# Patient Record
Sex: Female | Born: 1975 | Race: White | Hispanic: No | Marital: Married | Smoking: Never smoker
Health system: Southern US, Community
[De-identification: ages and names within clinical notes are randomized; demographics above are authoritative.]

## PROBLEM LIST (undated history)

## (undated) ENCOUNTER — Emergency Department (HOSPITAL_COMMUNITY): Payer: Self-pay

## (undated) DIAGNOSIS — N809 Endometriosis, unspecified: Secondary | ICD-10-CM

## (undated) DIAGNOSIS — Z800 Family history of malignant neoplasm of digestive organs: Secondary | ICD-10-CM

## (undated) DIAGNOSIS — IMO0002 Reserved for concepts with insufficient information to code with codable children: Secondary | ICD-10-CM

## (undated) DIAGNOSIS — R112 Nausea with vomiting, unspecified: Secondary | ICD-10-CM

## (undated) DIAGNOSIS — Z9889 Other specified postprocedural states: Secondary | ICD-10-CM

## (undated) DIAGNOSIS — Z8249 Family history of ischemic heart disease and other diseases of the circulatory system: Secondary | ICD-10-CM

## (undated) DIAGNOSIS — G43709 Chronic migraine without aura, not intractable, without status migrainosus: Secondary | ICD-10-CM

## (undated) HISTORY — DX: Endometriosis, unspecified: N80.9

## (undated) HISTORY — DX: Family history of malignant neoplasm of digestive organs: Z80.0

## (undated) HISTORY — DX: Chronic migraine without aura, not intractable, without status migrainosus: G43.709

## (undated) HISTORY — DX: Family history of ischemic heart disease and other diseases of the circulatory system: Z82.49

## (undated) HISTORY — DX: Reserved for concepts with insufficient information to code with codable children: IMO0002

---

## 2012-06-26 HISTORY — PX: OOPHORECTOMY: SHX86

## 2012-06-26 HISTORY — PX: SALPINGECTOMY: SHX328

## 2015-11-18 ENCOUNTER — Other Ambulatory Visit (HOSPITAL_COMMUNITY)
Admission: RE | Admit: 2015-11-18 | Discharge: 2015-11-18 | Disposition: A | Discharge status: Home / Self Care | Payer: 59 | Source: Ambulatory Visit | Attending: Obstetrics & Gynecology | Admitting: Obstetrics & Gynecology

## 2015-11-18 ENCOUNTER — Other Ambulatory Visit: Payer: Self-pay | Admitting: Obstetrics & Gynecology

## 2015-11-18 DIAGNOSIS — Z1151 Encounter for screening for human papillomavirus (HPV): Secondary | ICD-10-CM | POA: Diagnosis not present

## 2015-11-18 DIAGNOSIS — Z01419 Encounter for gynecological examination (general) (routine) without abnormal findings: Secondary | ICD-10-CM | POA: Diagnosis present

## 2015-11-23 LAB — CYTOLOGY - PAP

## 2016-04-12 ENCOUNTER — Telehealth: Payer: Self-pay | Admitting: Cardiology

## 2016-04-12 NOTE — Telephone Encounter (Signed)
Received records from [place] for appointment on 05/04/16 with Dr Ellyn Hack.  Records given to [facility] (medical records) for Dr Allison Quarry schedule on 05/04/16. lp

## 2016-05-02 ENCOUNTER — Encounter: Payer: Self-pay | Admitting: *Deleted

## 2016-05-04 ENCOUNTER — Ambulatory Visit (INDEPENDENT_AMBULATORY_CARE_PROVIDER_SITE_OTHER): Payer: 59 | Admitting: Cardiology

## 2016-05-04 ENCOUNTER — Encounter: Payer: Self-pay | Admitting: Cardiology

## 2016-05-04 VITALS — BP 108/64 | HR 80 | Ht 63.0 in | Wt 127.0 lb

## 2016-05-04 DIAGNOSIS — Z87898 Personal history of other specified conditions: Secondary | ICD-10-CM | POA: Diagnosis not present

## 2016-05-04 DIAGNOSIS — Z8249 Family history of ischemic heart disease and other diseases of the circulatory system: Secondary | ICD-10-CM | POA: Insufficient documentation

## 2016-05-04 NOTE — Patient Instructions (Signed)
SCHEDULE AT [place] has requested that you have an echocardiogram. Echocardiography is a painless test that uses sound waves to create images of your heart. It provides your doctor with information about the size and shape of your heart and how well your heart's chambers and valves are working. This procedure takes approximately one hour. There are no restrictions for this procedure.    NO OTHER CHANGES   Your physician recommends that you schedule a follow-up appointment in: 1 MONTHS WITH DR HARDING- IF TEST IS ABNORMAL,IF TEST IS NORMAL -PRN ( ON AS NEEDED BASIS)

## 2016-05-04 NOTE — Assessment & Plan Note (Signed)
She has a brother with a recent diagnosis of hypertrophic cardiomyopathy. It is reasonable for [place] to evaluate her with an echocardiogram to assess for hypertrophic her myocardium mouth feels well. Especially with her 3 sons. Features that make me more concerned that she may have the condition are heard at the site dizziness and prior syncopal events as well as her intermittent chest discomfort.  Plan: 2-D echocardiogram

## 2016-05-04 NOTE — Progress Notes (Signed)
PCP: Cyndy Freeze, MD  Clinic Note: Chief Complaint  Patient presents with  . Follow-up    New patient - consultation for cardiomyopathy.    HPI: Tina Morris is a 40 y.o. female with a PMH below who presents today for Cardiology consultation with family history of hypertrophic cardiomyopathy diagnosed in her brother.  Tina Morris was seen by her PCP on October 10. She was doing relatively well without any major complaints in general clinical evaluation. Based on her brother diagnosed with hypertrophic or adenopathy, there was a recommendation from his cardiologist that she have cardiology evaluation.  Recent Hospitalizations: None  Studies Reviewed: Her clinic EKG was evaluated showing sinus rhythm with nonspecific septal T-wave changes as well as possible RSR prime. Essentially normal axis and intervals. Relatively normal EKG. - Labs reviewed as noted below.  Interval History:  Presents for cardiology consultation. With intensive questioning, she notes that she has had chronic dizziness associated with being so dehydrated and with heat. She mostly notes it when she is in the shower. Years ago she had syncope getting into the shower without having anything to eat or drink. She controls this now by drinking something such as orange juice prior to getting the shower. She has not had any further syncope. She also notes that since her teenage years she's had these intermittent episodes of chest tightness/pain that is such shoots across her chest. The episodes may be last about a minute at a time and they're not at all associated with any particular activity - it can occur both at rest and with activity. Sometimes activity makes it worse, sometimes it doesn't.  Otherwise really no classic exertional chest tightness or pressure. No exertional dyspnea. She does not exercise, but is always on the go. Other than the shower related dizziness, no recurrent syncope or near syncope. No rapid irregular  heartbeats palpitations.   No TIA/amaurosis fugax symptoms.  ROS: A comprehensive was performed. Review of Systems  Constitutional: Negative.   HENT: Negative.   Eyes: Negative.   Respiratory: Negative.   Cardiovascular:       Per history of present illness  Gastrointestinal: Negative.  Negative for blood in stool and melena.  Genitourinary: Negative.  Negative for hematuria.  Musculoskeletal: Negative.   Skin: Negative.   Neurological: Positive for dizziness (Per history of present illness). Negative for speech change, focal weakness, loss of consciousness (None recently - with the shower. See history of present illness) and headaches.  Psychiatric/Behavioral: Negative for depression and memory loss. The patient is not nervous/anxious and does not have insomnia.   All other systems reviewed and are negative.   Past Medical History:  Diagnosis Date  . Chronic migraine   . Endometriosis   . Family history of colon cancer requiring screening colonoscopy   . Family history of hypertrophic cardiomyopathy    In her brother (who is 23 years older)    Past Surgical History:  Procedure Laterality Date  . CESAREAN SECTION  [id number]  . OOPHORECTOMY Left 2014  . SALPINGECTOMY Left 2014    Prior to Admission medications   Medication Sig Taking? Authorizing Provider  BIOTIN PO Take 1 tablet by mouth daily. Yes Historical Provider, MD  Flax Oil-Fish Oil-Borage Oil ([employer] OMEGA-3 PO) Take 1 tablet by mouth daily. Yes Historical Provider, MD  MAGNESIUM PO Take 1 tablet by mouth daily. Yes Historical Provider, MD  Multiple Vitamin (MULTIVITAMIN) tablet Take 1 tablet by mouth daily. Yes Historical Provider, MD  Prenatal Vit-Fe Fumarate-FA (PRENATAL MULTIVITAMIN)  TABS tablet Take 1 tablet by mouth daily at 12 noon. Yes Historical Provider, MD  TURMERIC PO Take 1 tablet by mouth daily. Yes Historical Provider, MD   Allergies  Allergen Reactions  . Amoxicillin Anaphylaxis  . Penicillins  Anaphylaxis  . Sulfa Antibiotics Anaphylaxis     Social History   Social History  . Marital status: Unknown    Spouse name: N/A  . Number of children: 3  . Years of education: N/A   Social History Main Topics  . Smoking status: Never Smoker  . Smokeless tobacco: Never Used  . Alcohol use 1.2 - 2.4 oz/week    1 - 2 Glasses of wine, 1 - 2 Cans of beer per week  . Drug use: Unknown  . Sexual activity: Not Asked   Other Topics Concern  . None   Social History Narrative   Married with 3 sons aged 67, 51 and 65    Family History  Problem Relation Age of Onset  . Breast cancer Mother   . Stroke Mother   . Hypercholesterolemia Mother   . Hypertension Mother   . CAD Mother   . Atrial fibrillation Mother   . Valvular heart disease Mother 18    Status post aVR/MVR  . Lung cancer Paternal Grandmother   . Hypercholesterolemia Father   . Hypertension Father   . Hypertrophic cardiomyopathy Brother 66    Unsure if this is familial or hypertensive  . Colon cancer Maternal Grandmother   . Congenital Murmur Son   . Healthy Son   . Congenital Murmur Son     [place] in heart.  . Arrhythmia Son     Extra heartbeats?  . Colon cancer Brother 27  - complete PMH, PSH, family and social history entered by Dr. Dimitri Ped Readings from Last 3 Encounters:  05/04/16 57.6 kg (127 lb)    PHYSICAL EXAM BP 108/64   Pulse 80   Ht 5\' 3"  (1.6 m)   Wt 57.6 kg (127 lb)   BMI 22.50 kg/m  General appearance: alert, cooperative, appears stated age, no distress and Healthy-appearing. Well-nourished and well-groomed. HEENT: [place]/AT, EOMI, MMM, anicteric sclera Neck: no adenopathy, no carotid bruit and no JVD Lungs: clear to auscultation bilaterally, normal percussion bilaterally and non-labored Heart: regular rate and rhythm, S1& S2 normal, no murmur, click, rub - cannot exclude soft S4. Nondisplaced PMI. Abdomen: soft, non-tender; bowel sounds normal; no masses,  no organomegaly; no  HJR Extremities: extremities normal, atraumatic, no cyanosis, or edema  Pulses: 2+ and symmetric;  Skin: mobility and turgor normal, no evidence of bleeding or bruising and no lesions noted  Neurologic: Mental status: Alert, oriented, thought content appropriate; normal gait Cranial nerves: normal (II-XII grossly intact)    Adult ECG Report  - PCPs office EKG reviewed  Other studies Reviewed: Additional studies/ records that were reviewed today include:  Recent Labs:  CT scan report from PCPs office - labs from 02/03/2016  CBC: W8.6, H/H 13.9/41.9, platelets 223.  Sodium 140, potassium 4.4, chloride 103, bicarbonate 20, BUN 9, glucose 86, calcium 9.2, total protein 6.9, albumin 4.3, total bilirubin 0.3, alkaline phosphatase 54, AST/ALT 14/12.  Total cholesterol 185, triglycerides 139, HDL 50, LDL 105   ASSESSMENT / PLAN: Problem List Items Addressed This Visit    H/O syncope (Chronic)    No further symptoms. I suspect this is truly related to hypovolemia. She would be more susceptible to this type of symptom if she does have hypertrophic cardiomyopathy, therefore  we'll check 2-D echo.      Relevant Orders   ECHOCARDIOGRAM COMPLETE   Family history of hypertrophic cardiomyopathy - Primary (Chronic)    She has a brother with a recent diagnosis of hypertrophic cardiomyopathy. It is reasonable for [place] to evaluate her with an echocardiogram to assess for hypertrophic her myocardium mouth feels well. Especially with her 3 sons. Features that make me more concerned that she may have the condition are heard at the site dizziness and prior syncopal events as well as her intermittent chest discomfort.  Plan: 2-D echocardiogram      Relevant Orders   ECHOCARDIOGRAM COMPLETE     Extensive family history reviewed including both her brother and her sons. We discussed multiple different symptoms that have gone on since a young age. Prolonged discussion (~45 min-1 hr with 20+ min chart  review), greater than 50% of which was in direct consultation.  Current medicines are reviewed at length with the patient today. (+/- concerns) None The following changes have been made: None  Patient Instructions  SCHEDULE AT [place] has requested that you have an echocardiogram. Echocardiography is a painless test that uses sound waves to create images of your heart. It provides your doctor with information about the size and shape of your heart and how well your heart's chambers and valves are working. This procedure takes approximately one hour. There are no restrictions for this procedure.    NO OTHER CHANGES   Your physician recommends that you schedule a follow-up appointment in: 1 MONTHS WITH DR Marrisa Kimber- IF TEST IS ABNORMAL,IF TEST IS NORMAL -PRN ( ON AS NEEDED BASIS)    Studies Ordered:   Orders Placed This Encounter  Procedures  . ECHOCARDIOGRAM COMPLETE      Glenetta Hew, M.D., M.S. Interventional Cardiologist   Pager # [phone] Phone # [phone] [address] [place], [place] [address]

## 2016-05-04 NOTE — Assessment & Plan Note (Signed)
No further symptoms. I suspect this is truly related to hypovolemia. She would be more susceptible to this type of symptom if she does have hypertrophic cardiomyopathy, therefore we'll check 2-D echo.

## 2016-05-26 ENCOUNTER — Ambulatory Visit (HOSPITAL_COMMUNITY): Payer: 59 | Attending: Cardiovascular Disease

## 2016-05-26 ENCOUNTER — Other Ambulatory Visit: Payer: Self-pay

## 2016-05-26 DIAGNOSIS — Z87898 Personal history of other specified conditions: Secondary | ICD-10-CM | POA: Diagnosis not present

## 2016-05-26 DIAGNOSIS — Z8249 Family history of ischemic heart disease and other diseases of the circulatory system: Secondary | ICD-10-CM | POA: Insufficient documentation

## 2016-05-29 ENCOUNTER — Ambulatory Visit: Payer: 59 | Admitting: Cardiology

## 2016-05-29 ENCOUNTER — Telehealth: Payer: Self-pay | Admitting: Cardiology

## 2016-05-29 NOTE — Telephone Encounter (Signed)
Please call Echo results to patient--she cancelled her appointment with Dr. Ellyn Hack today--thought it may be unnecessary.

## 2016-05-29 NOTE — Progress Notes (Signed)
Echo results:  Essentially normal echo Good news: Essentially normal echocardiogram and normal pump function and normal valve function. (Ejection fraction) EF: 55-60% - normal No evidence of hypertrophic cardiomyopathy. Normal wall thickness. No regional wall motion abnormalities = No signs to suggest heart attack.  Glenetta Hew, MD  Please for Dr. Trilby Drummer

## 2016-05-29 NOTE — Telephone Encounter (Signed)
Spoke to patient.  Echo Result given. Patient had question. per DR harding probably recheck in 5 years. Inquiring about children 9,14,18 ( possible after 39 Yrs old.check children unless sports activity - talk with pediatrician/pediatric cardiologist.) Verbalized understanding.

## 2017-03-14 ENCOUNTER — Emergency Department (HOSPITAL_COMMUNITY): Payer: BLUE CROSS/BLUE SHIELD

## 2017-03-14 ENCOUNTER — Emergency Department (HOSPITAL_COMMUNITY)
Admission: EM | Admit: 2017-03-14 | Discharge: 2017-03-15 | Disposition: A | Discharge status: Home / Self Care | Payer: BLUE CROSS/BLUE SHIELD | Attending: Emergency Medicine | Admitting: Emergency Medicine

## 2017-03-14 ENCOUNTER — Encounter (HOSPITAL_COMMUNITY): Payer: Self-pay | Admitting: *Deleted

## 2017-03-14 DIAGNOSIS — R0602 Shortness of breath: Secondary | ICD-10-CM

## 2017-03-14 DIAGNOSIS — Z79899 Other long term (current) drug therapy: Secondary | ICD-10-CM | POA: Insufficient documentation

## 2017-03-14 DIAGNOSIS — F419 Anxiety disorder, unspecified: Secondary | ICD-10-CM | POA: Insufficient documentation

## 2017-03-14 DIAGNOSIS — R091 Pleurisy: Secondary | ICD-10-CM | POA: Insufficient documentation

## 2017-03-14 DIAGNOSIS — R071 Chest pain on breathing: Secondary | ICD-10-CM | POA: Diagnosis present

## 2017-03-14 LAB — CBC
HEMATOCRIT: 38.7 % (ref 36.0–46.0)
HEMOGLOBIN: 12.9 g/dL (ref 12.0–15.0)
MCH: 29.4 pg (ref 26.0–34.0)
MCHC: 33.3 g/dL (ref 30.0–36.0)
MCV: 88.2 fL (ref 78.0–100.0)
Platelets: 222 10*3/uL (ref 150–400)
RBC: 4.39 MIL/uL (ref 3.87–5.11)
RDW: 12.8 % (ref 11.5–15.5)
WBC: 10.6 10*3/uL — ABNORMAL HIGH (ref 4.0–10.5)

## 2017-03-14 LAB — BASIC METABOLIC PANEL
ANION GAP: 11 (ref 5–15)
BUN: 9 mg/dL (ref 6–20)
CO2: 19 mmol/L — ABNORMAL LOW (ref 22–32)
Calcium: 9.2 mg/dL (ref 8.9–10.3)
Chloride: 108 mmol/L (ref 101–111)
Creatinine, Ser: 0.73 mg/dL (ref 0.44–1.00)
GFR calc non Af Amer: >60 mL/min (ref >60)
Glucose, Bld: 89 mg/dL (ref 65–99)
POTASSIUM: 4 mmol/L (ref 3.5–5.1)
SODIUM: 138 mmol/L (ref 135–145)

## 2017-03-14 LAB — I-STAT TROPONIN, ED: Troponin i, poc: 0.01 ng/mL (ref 0.00–0.08)

## 2017-03-14 LAB — D-DIMER, QUANTITATIVE (NOT AT ARMC)

## 2017-03-14 MED ORDER — NAPROXEN 500 MG PO TABS: 500.0000 mg | ORAL_TABLET | Freq: Two times a day (BID) | ORAL | 0 refills | Status: DC

## 2017-03-14 MED ORDER — ALPRAZOLAM 0.5 MG PO TABS: 0.5000 mg | ORAL_TABLET | Freq: Two times a day (BID) | ORAL | 0 refills | Status: DC | PRN

## 2017-03-14 MED ORDER — IOPAMIDOL (ISOVUE-370) INJECTION 76%
INTRAVENOUS | Status: AC
  Administered 2017-03-14: 100 mL
  Filled 2017-03-14: qty 100

## 2017-03-14 MED ORDER — MORPHINE SULFATE (PF) 4 MG/ML IV SOLN
4.0000 mg | Freq: Once | INTRAVENOUS | Status: AC
  Administered 2017-03-14: 4 mg via INTRAVENOUS
  Filled 2017-03-14: qty 1

## 2017-03-14 MED ORDER — KETOROLAC TROMETHAMINE 30 MG/ML IJ SOLN
30.0000 mg | Freq: Once | INTRAMUSCULAR | Status: AC
  Administered 2017-03-14: 30 mg via INTRAVENOUS
  Filled 2017-03-14: qty 1

## 2017-03-14 MED ORDER — LORAZEPAM 2 MG/ML IJ SOLN
1.0000 mg | Freq: Once | INTRAMUSCULAR | Status: AC
  Administered 2017-03-14: 1 mg via INTRAVENOUS
  Filled 2017-03-14: qty 1

## 2017-03-14 NOTE — ED Notes (Signed)
Holding medication per pt request pt denies pain at present
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
## 2017-03-14 NOTE — ED Triage Notes (Signed)
Pt reports onset today of mid chest discomfort and sob, reports feeling like it is difficult to take a deep breath. Pt has tremors of her extremities and appears anxious at triage. Went to an ucc and sent here to r/o PE.

## 2017-03-14 NOTE — ED Notes (Signed)
Patient transported to CT 

## 2018-12-31 DIAGNOSIS — R102 Pelvic and perineal pain: Secondary | ICD-10-CM | POA: Diagnosis not present

## 2018-12-31 DIAGNOSIS — N809 Endometriosis, unspecified: Secondary | ICD-10-CM | POA: Diagnosis not present

## 2018-12-31 DIAGNOSIS — Z01411 Encounter for gynecological examination (general) (routine) with abnormal findings: Secondary | ICD-10-CM | POA: Diagnosis not present

## 2018-12-31 LAB — CYTOLOGY - PAP: Pap: NEGATIVE

## 2019-04-07 DIAGNOSIS — N83201 Unspecified ovarian cyst, right side: Secondary | ICD-10-CM | POA: Diagnosis not present

## 2019-04-07 DIAGNOSIS — M545 Low back pain: Secondary | ICD-10-CM | POA: Diagnosis not present

## 2019-04-25 DIAGNOSIS — M545 Low back pain: Secondary | ICD-10-CM | POA: Diagnosis not present

## 2019-04-25 DIAGNOSIS — N83201 Unspecified ovarian cyst, right side: Secondary | ICD-10-CM | POA: Diagnosis not present

## 2019-04-25 DIAGNOSIS — R3989 Other symptoms and signs involving the genitourinary system: Secondary | ICD-10-CM | POA: Diagnosis not present

## 2019-04-25 DIAGNOSIS — N809 Endometriosis, unspecified: Secondary | ICD-10-CM | POA: Diagnosis not present

## 2019-04-30 ENCOUNTER — Other Ambulatory Visit: Payer: Self-pay

## 2019-04-30 ENCOUNTER — Encounter (HOSPITAL_BASED_OUTPATIENT_CLINIC_OR_DEPARTMENT_OTHER): Payer: Self-pay | Admitting: *Deleted

## 2019-04-30 NOTE — H&P (Signed)
43yo [id number] who presents for laparoscopic ovarian cystectomy and possible resection of endometriosis.  In review- she has a known right Ovarian cyst- Recent US- (04/07/2019)- 8.3cm anteverted uterus- small ant 1.4cm fibroid- stable in size. Prior LSO, Right ovary- 5.5x5.4x3.8 cyst with thin septations- stable in size, avascular. Today she notes that she is not having any pelvic or abdominal pain. Some left sided back pain, but pain is only noticeable during her menses; however, during that time she is miserable. In review, she had called in on 10/08 due to significant cramps and back pain. Pain woke her up and was severe for several hours. While the pain does improve with Ibuprofen, she is taking round the clock because as soon as it wears off, the pain returns. Denies nausea/vomiting, no other symptoms Last US performed Sept 2017 that showed 4.6cm cyst with thin septations- increase in size from prior US in March.   -h/o Endometriosis- Currently on OCPs for management. Menses regular each month- denies HMB or dysmenorrhea.      Current Medications  Taking   Larissia(Levonorgestrel-Ethinyl Estrad) 0.1-20 MG-MCG Tablet 1 tablet Orally Once a day   Fish Oil 500 MG Capsule Orally   Multivitamin Tablet by mouth   Turmeric 500 MG Capsule Orally   Spirulina 500 MG Tablet Orally   Biotin 400 MCG Capsule 1 tablet Orally Once a day   Vitamin B-1 100 MG Tablet 1 tablet Orally Once a day   Magnesium 200 MG Tablet 1 capsule with a meal Orally Once a day   Valerian Root   Not-Taking   Aviane(Levonorgestrel-Ethinyl Estrad) 0.1-20 MG-MCG Tablet 1 tablet Orally once a day   Medication List reviewed and reconciled with the patient    Past Medical History  Endometriosis.    Surgical History  C-section   Laparoscopy, D&C for Endometriosis x 3 or 4 times. During one of those surgeries, left ovary and left fallopian tube was removed.    Family History  Father: alive, Heart disease, diagnosed with  Hypertension  Mother: deceased, Hypertension  Paternal Marshall Father: deceased, Hypertension  Paternal Grand Mother: deceased, Hypertension, Ovarian cancer  Maternal Grand Father: deceased, Hypertension  Maternal Grand Mother: deceased, Uterine cancer, Hypertension, Breast cancer, Colon cancer  Brother 1: deceased, Colon Cancer  Brother2: alive, Heart disease   Social History  General:  no EXPOSURE TO PASSIVE SMOKE.  no Alcohol.  Tobacco use  cigarettes: Never smoked Tobacco history last updated 04/25/2019 no Recreational drug use.    Gyn History  Sexual activity currently sexually active.  Periods : every month.  LMP 04-02-2019 .  Birth control ocp.  Last pap smear date 12-2018.  Denies H/O Last mammogram date.  Denies H/O Abnormal pap smear.    OB History  Number of pregnancies 3.  Pregnancy # 1 live birth, vaginal delivery.  Pregnancy # 2 live birth, C-section-Placenta abruption.  Pregnancy # 3 live birth, C-section.    Allergies  Penicillin: anaphylaxis  Amoxicillin: anaphylaxis  Sulfa Drugs: Itching   Vicryl sutures   Hospitalization/Major Diagnostic Procedure  See surgeries    Review of Systems  CONSTITUTIONAL:  no Appetite changes. no Chills. no Fatigue. no Fever.  CARDIOLOGY:  no Chest pain.  RESPIRATORY:  no Shortness of breath. no Cough.  UROLOGY:  no Dysuria. no Urinary frequency. no Urinary incontinence. no Urinary urgency.  GASTROENTEROLOGY:  no Abdominal pain. no Change in bowel habits. no Change in bowel movements.  FEMALE REPRODUCTIVE:  See HPI for details. no Abnormal vaginal discharge. no  Breast lumps or discharge. no Breast pain. no Hot flashes. no Sexual problems. no Vaginal irritation. no Vaginal itching.  NEUROLOGY:  no Dizziness. no Headache.  PSYCHOLOGY:  no Anxiety. no Depression.  SKIN:  no Rash. no Hives.  HEMATOLOGY/LYMPH:  no Anemia. Using Blood Thinners no.     Vital Signs  Wt 131.4, Wt change -3.2 lb, Ht 67.5, BMI 20.27,  Temp 97.9, Pulse sitting 70, BP sitting 114/70.   Examination  General Examination: CONSTITUTIONAL: well developed, well nourished.  SKIN: warm and dry, no rashes.  NECK: supple, normal appearance.  LUNGS: clear to auscultation bilaterally, no wheezes, rhonchi, rales.  HEART: no murmurs, regular rate and rhythm.  ABDOMEN: soft and not tender, no masses palpated, no rebound, no rigidity.  MUSCULOSKELETAL no calf tenderness bilaterally.  EXTREMITIES: no edema present.  PSYCH: appropriate mood and affect.     A/P: 43yo G2P2 who presents for laparoscopic ovarian cystectomy and possible resection of endometriosis. -NPO -LR @ 125cc/hr -SCDs to OR -Risk/benefit and alternatives reviewed with patient including but not limited to risk of bleeding, infection and injury to surrounding organs.  Discussed potential for oophorectomy- due to prior left oophorectomy, will plan for ovarian reservation if possible.  Questions and concerns were addressed and she desires to proceed.  Janyth Pupa, DO [phone] (cell) [phone] (office)

## 2019-05-03 ENCOUNTER — Other Ambulatory Visit (HOSPITAL_COMMUNITY)
Admission: RE | Admit: 2019-05-03 | Discharge: 2019-05-03 | Disposition: A | Discharge status: Home / Self Care | Payer: BC Managed Care – PPO | Source: Ambulatory Visit | Attending: Obstetrics & Gynecology | Admitting: Obstetrics & Gynecology

## 2019-05-03 DIAGNOSIS — Z20828 Contact with and (suspected) exposure to other viral communicable diseases: Secondary | ICD-10-CM | POA: Diagnosis not present

## 2019-05-03 DIAGNOSIS — Z01812 Encounter for preprocedural laboratory examination: Secondary | ICD-10-CM | POA: Diagnosis not present

## 2019-05-04 LAB — NOVEL CORONAVIRUS, NAA (HOSP ORDER, SEND-OUT TO REF LAB; TAT 18-24 HRS): SARS-CoV-2, NAA: NOT DETECTED

## 2019-05-06 ENCOUNTER — Encounter (HOSPITAL_BASED_OUTPATIENT_CLINIC_OR_DEPARTMENT_OTHER)
Admission: RE | Admit: 2019-05-06 | Discharge: 2019-05-06 | Disposition: A | Discharge status: Home / Self Care | Payer: BC Managed Care – PPO | Source: Ambulatory Visit | Attending: Obstetrics & Gynecology | Admitting: Obstetrics & Gynecology

## 2019-05-06 ENCOUNTER — Other Ambulatory Visit: Payer: Self-pay

## 2019-05-06 DIAGNOSIS — Z01812 Encounter for preprocedural laboratory examination: Secondary | ICD-10-CM | POA: Diagnosis not present

## 2019-05-06 DIAGNOSIS — D270 Benign neoplasm of right ovary: Secondary | ICD-10-CM | POA: Diagnosis not present

## 2019-05-06 DIAGNOSIS — Z793 Long term (current) use of hormonal contraceptives: Secondary | ICD-10-CM | POA: Diagnosis not present

## 2019-05-06 DIAGNOSIS — N83201 Unspecified ovarian cyst, right side: Secondary | ICD-10-CM | POA: Diagnosis not present

## 2019-05-06 DIAGNOSIS — N803 Endometriosis of pelvic peritoneum: Secondary | ICD-10-CM | POA: Diagnosis not present

## 2019-05-06 LAB — COMPREHENSIVE METABOLIC PANEL
ALT: 12 U/L (ref 0–44)
AST: 16 U/L (ref 15–41)
Albumin: 3.9 g/dL (ref 3.5–5.0)
Alkaline Phosphatase: 59 U/L (ref 38–126)
Anion gap: 8 (ref 5–15)
BUN: 12 mg/dL (ref 6–20)
CO2: 23 mmol/L (ref 22–32)
Calcium: 9.2 mg/dL (ref 8.9–10.3)
Chloride: 106 mmol/L (ref 98–111)
Creatinine, Ser: 0.85 mg/dL (ref 0.44–1.00)
GFR calc Af Amer: >60 mL/min (ref >60)
GFR calc non Af Amer: >60 mL/min (ref >60)
Glucose, Bld: 88 mg/dL (ref 70–99)
Potassium: 4.5 mmol/L (ref 3.5–5.1)
Sodium: 137 mmol/L (ref 135–145)
Total Bilirubin: 0.6 mg/dL (ref 0.3–1.2)
Total Protein: 6.6 g/dL (ref 6.5–8.1)

## 2019-05-06 LAB — CBC
HCT: 43.9 % (ref 36.0–46.0)
Hemoglobin: 14.5 g/dL (ref 12.0–15.0)
MCH: 30.1 pg (ref 26.0–34.0)
MCHC: 33 g/dL (ref 30.0–36.0)
MCV: 91.1 fL (ref 80.0–100.0)
Platelets: 243 10*3/uL (ref 150–400)
RBC: 4.82 MIL/uL (ref 3.87–5.11)
RDW: 12.3 % (ref 11.5–15.5)
WBC: 8.3 10*3/uL (ref 4.0–10.5)
nRBC: 0 % (ref 0.0–0.2)

## 2019-05-06 LAB — TYPE AND SCREEN
ABO/RH(D): O POS
Antibody Screen: NEGATIVE

## 2019-05-06 LAB — POCT PREGNANCY, URINE: Preg Test, Ur: NEGATIVE

## 2019-05-06 LAB — ABO/RH: ABO/RH(D): O POS

## 2019-05-06 NOTE — Progress Notes (Signed)

## 2019-05-07 ENCOUNTER — Ambulatory Visit (HOSPITAL_BASED_OUTPATIENT_CLINIC_OR_DEPARTMENT_OTHER): Payer: BC Managed Care – PPO | Admitting: Certified Registered"

## 2019-05-07 ENCOUNTER — Other Ambulatory Visit: Payer: Self-pay

## 2019-05-07 ENCOUNTER — Ambulatory Visit (HOSPITAL_COMMUNITY)
Admission: RE | Admit: 2019-05-07 | Discharge: 2019-05-07 | Disposition: A | Discharge status: Home / Self Care | Payer: BC Managed Care – PPO | Source: Ambulatory Visit | Attending: Obstetrics & Gynecology | Admitting: Obstetrics & Gynecology

## 2019-05-07 ENCOUNTER — Encounter (HOSPITAL_BASED_OUTPATIENT_CLINIC_OR_DEPARTMENT_OTHER)
Admission: RE | Disposition: A | Discharge status: Home / Self Care | Payer: Self-pay | Source: Ambulatory Visit | Attending: Obstetrics & Gynecology

## 2019-05-07 ENCOUNTER — Encounter (HOSPITAL_BASED_OUTPATIENT_CLINIC_OR_DEPARTMENT_OTHER): Payer: Self-pay | Admitting: *Deleted

## 2019-05-07 DIAGNOSIS — N83201 Unspecified ovarian cyst, right side: Secondary | ICD-10-CM

## 2019-05-07 DIAGNOSIS — Z793 Long term (current) use of hormonal contraceptives: Secondary | ICD-10-CM | POA: Insufficient documentation

## 2019-05-07 DIAGNOSIS — N803 Endometriosis of pelvic peritoneum: Secondary | ICD-10-CM | POA: Diagnosis not present

## 2019-05-07 DIAGNOSIS — D270 Benign neoplasm of right ovary: Secondary | ICD-10-CM | POA: Diagnosis not present

## 2019-05-07 DIAGNOSIS — N809 Endometriosis, unspecified: Secondary | ICD-10-CM | POA: Diagnosis not present

## 2019-05-07 HISTORY — PX: LAPAROSCOPIC OVARIAN CYSTECTOMY: SHX6248

## 2019-05-07 HISTORY — DX: Nausea with vomiting, unspecified: R11.2

## 2019-05-07 HISTORY — DX: Other specified postprocedural states: Z98.890

## 2019-05-07 SURGERY — EXCISION, CYST, OVARY, LAPAROSCOPIC
Anesthesia: General | Site: Abdomen

## 2019-05-07 MED ORDER — IBUPROFEN 600 MG PO TABS: 600.0000 mg | ORAL_TABLET | Freq: Four times a day (QID) | ORAL | 0 refills | Status: DC | PRN

## 2019-05-07 MED ORDER — ACETAMINOPHEN 325 MG PO TABS: 325.0000 mg | ORAL_TABLET | ORAL | Status: DC | PRN

## 2019-05-07 MED ORDER — LIDOCAINE HCL 1 % IJ SOLN
INTRAMUSCULAR | Status: DC | PRN
  Administered 2019-05-07: 30 mL

## 2019-05-07 MED ORDER — ONDANSETRON HCL 4 MG/2ML IJ SOLN
INTRAMUSCULAR | Status: AC
  Filled 2019-05-07: qty 2

## 2019-05-07 MED ORDER — FENTANYL CITRATE (PF) 100 MCG/2ML IJ SOLN
INTRAMUSCULAR | Status: AC
  Filled 2019-05-07: qty 2

## 2019-05-07 MED ORDER — LIDOCAINE HCL (PF) 1 % IJ SOLN
INTRAMUSCULAR | Status: AC
  Filled 2019-05-07: qty 30

## 2019-05-07 MED ORDER — FENTANYL CITRATE (PF) 100 MCG/2ML IJ SOLN
25.0000 ug | INTRAMUSCULAR | Status: DC | PRN
  Administered 2019-05-07: 50 ug via INTRAVENOUS

## 2019-05-07 MED ORDER — SOD CITRATE-CITRIC ACID 500-334 MG/5ML PO SOLN
ORAL | Status: AC
  Filled 2019-05-07: qty 30

## 2019-05-07 MED ORDER — ROCURONIUM BROMIDE 100 MG/10ML IV SOLN
INTRAVENOUS | Status: DC | PRN
  Administered 2019-05-07: 60 mg via INTRAVENOUS

## 2019-05-07 MED ORDER — ESMOLOL HCL 100 MG/10ML IV SOLN
INTRAVENOUS | Status: AC
  Filled 2019-05-07: qty 10

## 2019-05-07 MED ORDER — ONDANSETRON HCL 4 MG/2ML IJ SOLN
4.0000 mg | Freq: Once | INTRAMUSCULAR | Status: AC | PRN
  Administered 2019-05-07: 14:00:00 4 mg via INTRAVENOUS

## 2019-05-07 MED ORDER — OXYCODONE HCL 5 MG PO TABS: 5.0000 mg | ORAL_TABLET | Freq: Once | ORAL | Status: DC | PRN

## 2019-05-07 MED ORDER — SOD CITRATE-CITRIC ACID 500-334 MG/5ML PO SOLN
30.0000 mL | ORAL | Status: AC
  Administered 2019-05-07: 12:00:00 30 mL via ORAL

## 2019-05-07 MED ORDER — KETOROLAC TROMETHAMINE 15 MG/ML IJ SOLN
INTRAMUSCULAR | Status: DC | PRN
  Administered 2019-05-07: 30 mg via INTRAVENOUS

## 2019-05-07 MED ORDER — PROPOFOL 10 MG/ML IV BOLUS
INTRAVENOUS | Status: DC | PRN
  Administered 2019-05-07: 200 mg via INTRAVENOUS

## 2019-05-07 MED ORDER — ACETAMINOPHEN 500 MG PO TABS
ORAL_TABLET | ORAL | Status: AC
  Filled 2019-05-07: qty 2

## 2019-05-07 MED ORDER — ACETAMINOPHEN 160 MG/5ML PO SOLN: 325.0000 mg | ORAL | Status: DC | PRN

## 2019-05-07 MED ORDER — SCOPOLAMINE 1 MG/3DAYS TD PT72
1.0000 | MEDICATED_PATCH | TRANSDERMAL | Status: DC
  Administered 2019-05-07: 12:00:00 1.5 mg via TRANSDERMAL

## 2019-05-07 MED ORDER — LIDOCAINE 2% (20 MG/ML) 5 ML SYRINGE
INTRAMUSCULAR | Status: AC
  Filled 2019-05-07: qty 5

## 2019-05-07 MED ORDER — KETOROLAC TROMETHAMINE 30 MG/ML IJ SOLN: 30.0000 mg | Freq: Once | INTRAMUSCULAR | Status: DC | PRN

## 2019-05-07 MED ORDER — MIDAZOLAM HCL 2 MG/2ML IJ SOLN
INTRAMUSCULAR | Status: AC
  Filled 2019-05-07: qty 2

## 2019-05-07 MED ORDER — FENTANYL CITRATE (PF) 100 MCG/2ML IJ SOLN
INTRAMUSCULAR | Status: DC | PRN
  Administered 2019-05-07: 100 ug via INTRAVENOUS

## 2019-05-07 MED ORDER — PROPOFOL 500 MG/50ML IV EMUL
INTRAVENOUS | Status: DC | PRN
  Administered 2019-05-07: 25 ug/kg/min via INTRAVENOUS

## 2019-05-07 MED ORDER — DOCUSATE SODIUM 100 MG PO CAPS: 100.0000 mg | ORAL_CAPSULE | Freq: Two times a day (BID) | ORAL | 0 refills | Status: DC | PRN

## 2019-05-07 MED ORDER — OXYCODONE-ACETAMINOPHEN 5-325 MG PO TABS: 1.0000 | ORAL_TABLET | Freq: Four times a day (QID) | ORAL | 0 refills | Status: AC | PRN

## 2019-05-07 MED ORDER — SUGAMMADEX SODIUM 200 MG/2ML IV SOLN
INTRAVENOUS | Status: DC | PRN
  Administered 2019-05-07: 200 mg via INTRAVENOUS

## 2019-05-07 MED ORDER — OXYCODONE HCL 5 MG/5ML PO SOLN: 5.0000 mg | Freq: Once | ORAL | Status: DC | PRN

## 2019-05-07 MED ORDER — ACETAMINOPHEN 500 MG PO TABS
1000.0000 mg | ORAL_TABLET | ORAL | Status: AC
  Administered 2019-05-07: 12:00:00 1000 mg via ORAL

## 2019-05-07 MED ORDER — LACTATED RINGERS IV SOLN
INTRAVENOUS | Status: DC
  Administered 2019-05-07 (×2): via INTRAVENOUS

## 2019-05-07 MED ORDER — SCOPOLAMINE 1 MG/3DAYS TD PT72
MEDICATED_PATCH | TRANSDERMAL | Status: AC
  Filled 2019-05-07: qty 1

## 2019-05-07 MED ORDER — LIDOCAINE HCL (CARDIAC) PF 100 MG/5ML IV SOSY
PREFILLED_SYRINGE | INTRAVENOUS | Status: DC | PRN
  Administered 2019-05-07: 60 mg via INTRAVENOUS

## 2019-05-07 MED ORDER — MIDAZOLAM HCL 5 MG/5ML IJ SOLN
INTRAMUSCULAR | Status: DC | PRN
  Administered 2019-05-07: 2 mg via INTRAVENOUS

## 2019-05-07 MED ORDER — LACTATED RINGERS IV SOLN: INTRAVENOUS | Status: DC

## 2019-05-07 MED ORDER — DEXAMETHASONE SODIUM PHOSPHATE 4 MG/ML IJ SOLN
INTRAMUSCULAR | Status: DC | PRN
  Administered 2019-05-07: 10 mg via INTRAVENOUS

## 2019-05-07 MED ORDER — MEPERIDINE HCL 25 MG/ML IJ SOLN: 6.2500 mg | INTRAMUSCULAR | Status: DC | PRN

## 2019-05-07 SURGICAL SUPPLY — 36 items
APPLICATOR ARISTA FLEXITIP XL (MISCELLANEOUS) ×3 IMPLANT
BRIEF STRETCH FOR OB PAD XXL (UNDERPADS AND DIAPERS) ×3 IMPLANT
CLOTH BEACON ORANGE TIMEOUT ST (SAFETY) IMPLANT
DERMABOND ADVANCED (GAUZE/BANDAGES/DRESSINGS) ×2
DERMABOND ADVANCED .7 DNX12 (GAUZE/BANDAGES/DRESSINGS) ×1 IMPLANT
DRSG OPSITE POSTOP 3X4 (GAUZE/BANDAGES/DRESSINGS) ×3 IMPLANT
DURAPREP 26ML APPLICATOR (WOUND CARE) ×3 IMPLANT
FORCEPS CUTTING 33CM 5MM (CUTTING FORCEPS) ×3 IMPLANT
GAUZE 4X4 16PLY RFD (DISPOSABLE) ×3 IMPLANT
GLOVE BIOGEL PI IND STRL 6.5 (GLOVE) ×2 IMPLANT
GLOVE BIOGEL PI IND STRL 7.0 (GLOVE) ×2 IMPLANT
GLOVE BIOGEL PI INDICATOR 6.5 (GLOVE) ×4
GLOVE BIOGEL PI INDICATOR 7.0 (GLOVE) ×4
GLOVE ECLIPSE 6.5 STRL STRAW (GLOVE) ×6 IMPLANT
GOWN STRL REUS W/TWL LRG LVL3 (GOWN DISPOSABLE) ×12 IMPLANT
HEMOSTAT ARISTA ABSORB 3G PWDR (HEMOSTASIS) ×3 IMPLANT
NEEDLE INSUFFLATION 120MM (ENDOMECHANICALS) ×3 IMPLANT
PACK LAPAROSCOPY BASIN (CUSTOM PROCEDURE TRAY) ×3 IMPLANT
PACK TRENDGUARD 450 HYBRID PRO (MISCELLANEOUS) ×1 IMPLANT
PACK TRENDGUARD 600 HYBRD PROC (MISCELLANEOUS) IMPLANT
PAD OB MATERNITY 4.3X12.25 (PERSONAL CARE ITEMS) ×3 IMPLANT
POUCH SPECIMEN RETRIEVAL 10MM (ENDOMECHANICALS) IMPLANT
SET IRRIG TUBING LAPAROSCOPIC (IRRIGATION / IRRIGATOR) ×3 IMPLANT
SHEARS HARMONIC ACE PLUS 36CM (ENDOMECHANICALS) ×3 IMPLANT
SLEEVE SCD COMPRESS KNEE MED (MISCELLANEOUS) ×3 IMPLANT
SLEEVE XCEL OPT CAN 5 100 (ENDOMECHANICALS) ×3 IMPLANT
SOLUTION ELECTROLUBE (MISCELLANEOUS) ×3 IMPLANT
SUT MON AB 4-0 PS1 27 (SUTURE) ×3 IMPLANT
SUT VICRYL 0 UR6 27IN ABS (SUTURE) ×3 IMPLANT
TOWEL GREEN STERILE FF (TOWEL DISPOSABLE) ×6 IMPLANT
TRAY FOLEY W/BAG SLVR 14FR LF (SET/KITS/TRAYS/PACK) ×3 IMPLANT
TRENDGUARD 450 HYBRID PRO PACK (MISCELLANEOUS) ×3
TRENDGUARD 600 HYBRID PROC PK (MISCELLANEOUS)
TROCAR XCEL NON-BLD 11X100MML (ENDOMECHANICALS) IMPLANT
TROCAR XCEL NON-BLD 5MMX100MML (ENDOMECHANICALS) ×3 IMPLANT
WARMER LAPAROSCOPE (MISCELLANEOUS) ×3 IMPLANT

## 2019-05-07 NOTE — Anesthesia Postprocedure Evaluation (Signed)
Anesthesia Post Note  Patient: Tina Morris  Procedure(s) Performed: LAPAROSCOPIC OVARIAN CYSTECTOMY, POSSIBLE RESECTION OF ENDOMETRIOSIS (Abdomen)     Patient location during evaluation: Phase II Anesthesia Type: General Level of consciousness: sedated Pain management: pain level controlled Vital Signs Assessment: post-procedure vital signs reviewed and stable Respiratory status: spontaneous breathing Cardiovascular status: stable Postop Assessment: no apparent nausea or vomiting Anesthetic complications: no    Last Vitals:  Vitals:   05/07/19 1415 05/07/19 1430  BP: 107/81 109/75  Pulse: 75 74  Resp: (!) 9 12  Temp:    SpO2: 100% 100%    Last Pain:  Vitals:   05/07/19 1430  TempSrc:   PainSc: 3    Pain Goal: Patients Stated Pain Goal: 3 (05/07/19 1209)                 Huston Foley

## 2019-05-07 NOTE — Anesthesia Preprocedure Evaluation (Signed)
Anesthesia Evaluation  Patient identified by MRN, date of birth, ID band Patient awake    History of Anesthesia Complications (+) PONV and history of anesthetic complications  Airway Mallampati: I       Dental no notable dental hx. (+) Teeth Intact   Pulmonary neg pulmonary ROS,    Pulmonary exam normal breath sounds clear to auscultation       Cardiovascular negative cardio ROS Normal cardiovascular exam Rhythm:Regular Rate:Normal     Neuro/Psych  Headaches, negative psych ROS   GI/Hepatic negative GI ROS, Neg liver ROS,   Endo/Other  negative endocrine ROS  Renal/GU negative Renal ROS  negative genitourinary   Musculoskeletal negative musculoskeletal ROS (+)   Abdominal Normal abdominal exam  (+)   Peds  Hematology negative hematology ROS (+)   Anesthesia Other Findings   Reproductive/Obstetrics                             Anesthesia Physical Anesthesia Plan  ASA: II  Anesthesia Plan: General   Post-op Pain Management:    Induction: Intravenous  PONV Risk Score and Plan: Ondansetron, Dexamethasone, Midazolam and Scopolamine patch - Pre-op  Airway Management Planned: Oral ETT  Additional Equipment: None  Intra-op Plan:   Post-operative Plan: Extubation in OR  Informed Consent: I have reviewed the patients History and Physical, chart, labs and discussed the procedure including the risks, benefits and alternatives for the proposed anesthesia with the patient or authorized representative who has indicated his/her understanding and acceptance.     Dental advisory given  Plan Discussed with: CRNA  Anesthesia Plan Comments:         Anesthesia Quick Evaluation

## 2019-05-07 NOTE — Op Note (Signed)
PREOPERATIVE DIAGNOSIS:  Right ovarian cyst POSTOPERATIVE DIAGNOSIS: same and endometriosis PROCEDURE PERFORMED: Right ovarian cystectomy and ablation of endometriosis SURGEON: Dr. Janyth Pupa ASSISTANT:Dr. Cyndia Skeeters ANESTHESIA: General endotracheal.  ESTIMATED BLOOD LOSS: 10cc.  URINE OUTPUT: 300cc of clear yellow urine at the end of the procedure.  IV FLUIDS: 800cc of crystalloid.  SPECIMEN(S): Right ovarian cyst wall COMPLICATIONS: None.  CONDITION: Stable.  FINDINGS: No ascites or peritoneal studding was appreciated.  Liver, gallbladder and bowel appeared grossly normal. Uterus normal size and shape.  Left tube and ovary surgically absent.  Enlarged right ovary, normal right fallopian tube.  Posterior cul-de-sac with dense adhesions to the bowel.  Small clear endometriotic implants noted at the posterior cul-de sac and a few small areas along the posterior lower uterine segment.    Informed consent was obtained from the patient prior to taking her to the operating room where anesthesia was found to be adequate. She was placed in dorsal lithotomy position and examined under anesthesia. She was prepped and draped in normal sterile fashion. The bladder was catheterized with a foley under sterile technique.  A bi-valve speculum was then placed and the anterior lip of the cervix was grasped with the single tooth tenaculum. The uterus was sounded to 7 cm and the hulka uterine manipulator was then advanced into the uterus to provide uterine mobility. The speculum and tenaculum were then removed.  Attention was turned to the patient's abdomen where 10cc of 1% lidocaine was injected.  A 5 mm infraumbilical skin incision was made with the scalpel. The veress needle was carefully introduced into the peritoneal cavity while tenting the abdominal wall. Intraperitoneal placement was confirmed by use of a saline-drop test.  The gas was connected and confirmed intrabdominal placement by a low initial  pressure of 5 mmHg. The abdomen was then insuflated with CO2 gas. The trocar and sleeve were then advanced without difficulty into the abdomen under direct visualization. Intraabdominal placement was confirmed by the laparoscope and surveillance of the abdomen was performed. Grossly normal appearing abdomen as mentioned in findings above.  Two additional 64mm skin incision were made in the left and right lower quadrants with placement of the trocar under direct visualization.   Attention was turned to the right ovary.  Using the harmonic and laparoscopic scissors a small incision was made into the right ovarian cyst.  Drainage of clear fluid was noted.  The cyst wall was grasped and separated from the ovarian tissue.  The cyst wall was removed through the port under direct visualization.  Hemostasis was obtained using the harmonic.  Irrigation was completed and excellent hemostasis was noted.  Close inspection of the abdomen was performed and the small endometriotic implants in the cul-de-sac and lower uterine segment were ablated using the harmonic.  Arista was placed over the right ovary.  The instruments were removed from the patients abdomen with air allowed to fully escape. The port sites were then closed with dermabond. The manipulator and foley catheter was then removed from the cervix.  Hemostasis was obtained using silver nitrate. The patient tolerated the procedure well with all sponge, lap, and needle counts correct. The patient was taken to recovery in stable condition.   Dr. Simona Huh was present to assist as no residents are available on our service and due to potential complications due to prior surgery and endometriosis  Janyth Pupa, DO [phone] (cell) [phone] (office)

## 2019-05-07 NOTE — Anesthesia Procedure Notes (Signed)
Procedure Name: Intubation Date/Time: 05/07/2019 12:39 PM Performed by: Signe Colt, CRNA Pre-anesthesia Checklist: Patient identified, Emergency Drugs available, Suction available and Patient being monitored Patient Re-evaluated:Patient Re-evaluated prior to induction Oxygen Delivery Method: Circle system utilized Preoxygenation: Pre-oxygenation with 100% oxygen Induction Type: IV induction Ventilation: Mask ventilation without difficulty Laryngoscope Size: Mac and 3 Grade View: Grade I Tube type: Oral Tube size: 6.5 mm Number of attempts: 1 Airway Equipment and Method: Stylet and Oral airway Placement Confirmation: ETT inserted through vocal cords under direct vision,  positive ETCO2 and breath sounds checked- equal and bilateral Secured at: 20 cm Tube secured with: Tape Dental Injury: Teeth and Oropharynx as per pre-operative assessment

## 2019-05-07 NOTE — Discharge Instructions (Addendum)
HOME INSTRUCTIONS  Please note any unusual or excessive bleeding, pain, swelling. Mild dizziness or drowsiness are normal for about 24 hours after surgery.   Shower when comfortable  Restrictions: No driving for 24 hours or while taking pain medications.  Activity:  No heavy lifting (> 10 lbs), nothing in vagina (no tampons, douching, or intercourse) x 4 weeks; no tub baths for 4 weeks Vaginal spotting is expected but if your bleeding is heavy, period like,  please call the office   Incision: the bandaids will fall off when they are ready to; you may clean your incision with mild soap and water but do not rub or scrub the incision site.  You may experience slight bloody drainage from your incision periodically.  This is normal.  If you experience a large amount of drainage or the incision opens, please call your physician who will likely direct you to the emergency department.  Diet:  You may return to your regular diet.  Do not eat large meals.  Eat small frequent meals throughout the day.  Continue to drink a good amount of water at least 6-8 glasses of water per day, hydration is very important for the healing process.  Pain Management: Take Motrin (ibuprofen) and/or Percocet as prescribed/needed for pain.  The percocet may cause constipation so be sure to take this medication along with a stool softener.  You may alternate between motrin and percocet.  If the percocet is too strong then take plain tylenol (acetaminophen) instead.  Always take prescription pain medication with food, it may cause constipation, increase fluids and fiber and you may want to take an over-the-counter stool softener like Colace as needed up to 2x a day.    Alcohol -- Avoid for 24 hours and while taking pain medications.  Nausea: Take sips of ginger ale or soda  Fever -- Call physician if temperature over 101 degrees  Follow up:  If you do not already have a follow up appointment scheduled, please call the  office at [phone].  If you experience fever (a temperature greater than 100.4), pain unrelieved by pain medication, shortness of breath, swelling of a single leg, or any other symptoms which are concerning to you please the office immediately.    No Tylenol before 6:15pm No ibuprofen before 9:30pm   Post Anesthesia Home Care Instructions  Activity: Get plenty of rest for the remainder of the day. A responsible individual must stay with you for 24 hours following the procedure.  For the next 24 hours, DO NOT: -Drive a car -Paediatric nurse -Drink alcoholic beverages -Take any medication unless instructed by your physician -Make any legal decisions or sign important papers.  Meals: Start with liquid foods such as gelatin or soup. Progress to regular foods as tolerated. Avoid greasy, spicy, heavy foods. If nausea and/or vomiting occur, drink only clear liquids until the nausea and/or vomiting subsides. Call your physician if vomiting continues.  Special Instructions/Symptoms: Your throat may feel dry or sore from the anesthesia or the breathing tube placed in your throat during surgery. If this causes discomfort, gargle with warm salt water. The discomfort should disappear within 24 hours.  If you had a scopolamine patch placed behind your ear for the management of post- operative nausea and/or vomiting:  1. The medication in the patch is effective for 72 hours, after which it should be removed.  Wrap patch in a tissue and discard in the trash. Wash hands thoroughly with soap and water. 2. You may remove  the patch earlier than 72 hours if you experience unpleasant side effects which may include dry mouth, dizziness or visual disturbances. 3. Avoid touching the patch. Wash your hands with soap and water after contact with the patch.

## 2019-05-07 NOTE — Interval H&P Note (Signed)
History and Physical Interval Note:  05/07/2019 12:09 PM  Tina Morris  has presented today for surgery, with the diagnosis of Right ovarian cyst, Endometriosis.  The various methods of treatment have been discussed with the patient and family. After consideration of risks, benefits and other options for treatment, the patient has consented to  Procedure(s): LAPAROSCOPIC OVARIAN CYSTECTOMY, POSSIBLE RESECTION OF ENDOMETRIOSIS (Right) as a surgical intervention.  The patient's history has been reviewed, patient examined, no change in status, stable for surgery.  I have reviewed the patient's chart and labs.  Questions were answered to the patient's satisfaction.     Annalee Genta

## 2019-05-07 NOTE — Transfer of Care (Signed)
Immediate Anesthesia Transfer of Care Note  Patient: Tina Morris  Procedure(s) Performed: LAPAROSCOPIC OVARIAN CYSTECTOMY, POSSIBLE RESECTION OF ENDOMETRIOSIS (Abdomen)  Patient Location: PACU  Anesthesia Type:General  Level of Consciousness: drowsy and patient cooperative  Airway & Oxygen Therapy: Patient Spontanous Breathing and Patient connected to face mask oxygen  Post-op Assessment: Report given to RN and Post -op Vital signs reviewed and stable  Post vital signs: Reviewed and stable  Last Vitals:  Vitals Value Taken Time  BP    Temp    Pulse 84 05/07/19 1350  Resp 18 05/07/19 1350  SpO2 100 % 05/07/19 1350  Vitals shown include unvalidated device data.  Last Pain:  Vitals:   05/07/19 1209  TempSrc: Oral  PainSc: 5       Patients Stated Pain Goal: 3 ([id number])  Complications: No apparent anesthesia complications

## 2019-05-08 ENCOUNTER — Encounter (HOSPITAL_BASED_OUTPATIENT_CLINIC_OR_DEPARTMENT_OTHER): Payer: Self-pay | Admitting: Obstetrics & Gynecology

## 2019-05-08 LAB — SURGICAL PATHOLOGY

## 2019-05-19 DIAGNOSIS — M542 Cervicalgia: Secondary | ICD-10-CM | POA: Diagnosis not present

## 2019-05-19 DIAGNOSIS — G44209 Tension-type headache, unspecified, not intractable: Secondary | ICD-10-CM | POA: Diagnosis not present

## 2019-05-19 DIAGNOSIS — M546 Pain in thoracic spine: Secondary | ICD-10-CM | POA: Diagnosis not present

## 2019-05-19 DIAGNOSIS — M545 Low back pain: Secondary | ICD-10-CM | POA: Diagnosis not present

## 2019-06-02 DIAGNOSIS — M546 Pain in thoracic spine: Secondary | ICD-10-CM | POA: Diagnosis not present

## 2019-06-02 DIAGNOSIS — M545 Low back pain: Secondary | ICD-10-CM | POA: Diagnosis not present

## 2019-06-02 DIAGNOSIS — M542 Cervicalgia: Secondary | ICD-10-CM | POA: Diagnosis not present

## 2019-06-02 DIAGNOSIS — G44209 Tension-type headache, unspecified, not intractable: Secondary | ICD-10-CM | POA: Diagnosis not present

## 2019-06-03 DIAGNOSIS — M79605 Pain in left leg: Secondary | ICD-10-CM | POA: Diagnosis not present

## 2019-06-03 DIAGNOSIS — E559 Vitamin D deficiency, unspecified: Secondary | ICD-10-CM | POA: Diagnosis not present

## 2019-06-03 DIAGNOSIS — R002 Palpitations: Secondary | ICD-10-CM | POA: Diagnosis not present

## 2019-06-03 DIAGNOSIS — Z13220 Encounter for screening for lipoid disorders: Secondary | ICD-10-CM | POA: Diagnosis not present

## 2019-06-03 DIAGNOSIS — K5900 Constipation, unspecified: Secondary | ICD-10-CM | POA: Diagnosis not present

## 2019-06-03 DIAGNOSIS — M25552 Pain in left hip: Secondary | ICD-10-CM | POA: Diagnosis not present

## 2019-06-03 DIAGNOSIS — M5126 Other intervertebral disc displacement, lumbar region: Secondary | ICD-10-CM | POA: Diagnosis not present

## 2019-06-03 DIAGNOSIS — M545 Low back pain: Secondary | ICD-10-CM | POA: Diagnosis not present

## 2019-06-24 DIAGNOSIS — M47896 Other spondylosis, lumbar region: Secondary | ICD-10-CM | POA: Diagnosis not present

## 2019-06-24 DIAGNOSIS — M5416 Radiculopathy, lumbar region: Secondary | ICD-10-CM | POA: Diagnosis not present

## 2019-06-24 DIAGNOSIS — M419 Scoliosis, unspecified: Secondary | ICD-10-CM | POA: Diagnosis not present

## 2019-06-25 ENCOUNTER — Other Ambulatory Visit: Payer: Self-pay | Admitting: Orthopaedic Surgery

## 2019-06-25 ENCOUNTER — Ambulatory Visit
Admission: RE | Admit: 2019-06-25 | Discharge: 2019-06-25 | Disposition: A | Discharge status: Home / Self Care | Payer: BC Managed Care – PPO | Source: Ambulatory Visit | Attending: Orthopaedic Surgery | Admitting: Orthopaedic Surgery

## 2019-06-25 ENCOUNTER — Other Ambulatory Visit: Payer: Self-pay

## 2019-06-25 DIAGNOSIS — M48061 Spinal stenosis, lumbar region without neurogenic claudication: Secondary | ICD-10-CM | POA: Diagnosis not present

## 2019-06-25 DIAGNOSIS — M5416 Radiculopathy, lumbar region: Secondary | ICD-10-CM

## 2019-07-23 DIAGNOSIS — I10 Essential (primary) hypertension: Secondary | ICD-10-CM | POA: Diagnosis not present

## 2019-07-23 DIAGNOSIS — M47896 Other spondylosis, lumbar region: Secondary | ICD-10-CM | POA: Diagnosis not present

## 2019-07-23 DIAGNOSIS — M5416 Radiculopathy, lumbar region: Secondary | ICD-10-CM | POA: Diagnosis not present

## 2019-07-23 DIAGNOSIS — Z6822 Body mass index (BMI) 22.0-22.9, adult: Secondary | ICD-10-CM | POA: Diagnosis not present

## 2019-09-09 DIAGNOSIS — R102 Pelvic and perineal pain: Secondary | ICD-10-CM | POA: Diagnosis not present

## 2019-09-09 DIAGNOSIS — N809 Endometriosis, unspecified: Secondary | ICD-10-CM | POA: Diagnosis not present

## 2019-09-09 DIAGNOSIS — G479 Sleep disorder, unspecified: Secondary | ICD-10-CM | POA: Diagnosis not present

## 2019-12-23 DIAGNOSIS — J3503 Chronic tonsillitis and adenoiditis: Secondary | ICD-10-CM | POA: Diagnosis not present

## 2019-12-23 DIAGNOSIS — J353 Hypertrophy of tonsils with hypertrophy of adenoids: Secondary | ICD-10-CM | POA: Diagnosis not present

## 2020-01-19 DIAGNOSIS — Z20828 Contact with and (suspected) exposure to other viral communicable diseases: Secondary | ICD-10-CM | POA: Diagnosis not present

## 2020-01-19 DIAGNOSIS — J3489 Other specified disorders of nose and nasal sinuses: Secondary | ICD-10-CM | POA: Diagnosis not present

## 2020-02-20 DIAGNOSIS — J329 Chronic sinusitis, unspecified: Secondary | ICD-10-CM | POA: Diagnosis not present

## 2020-02-20 DIAGNOSIS — Z20828 Contact with and (suspected) exposure to other viral communicable diseases: Secondary | ICD-10-CM | POA: Diagnosis not present

## 2020-02-20 DIAGNOSIS — J40 Bronchitis, not specified as acute or chronic: Secondary | ICD-10-CM | POA: Diagnosis not present

## 2020-02-20 DIAGNOSIS — R05 Cough: Secondary | ICD-10-CM | POA: Diagnosis not present

## 2020-02-27 DIAGNOSIS — Z01818 Encounter for other preprocedural examination: Secondary | ICD-10-CM | POA: Diagnosis not present

## 2020-03-04 DIAGNOSIS — J312 Chronic pharyngitis: Secondary | ICD-10-CM | POA: Diagnosis not present

## 2020-03-04 DIAGNOSIS — J353 Hypertrophy of tonsils with hypertrophy of adenoids: Secondary | ICD-10-CM | POA: Diagnosis not present

## 2020-03-04 DIAGNOSIS — J3501 Chronic tonsillitis: Secondary | ICD-10-CM | POA: Diagnosis not present

## 2020-03-04 DIAGNOSIS — J358 Other chronic diseases of tonsils and adenoids: Secondary | ICD-10-CM | POA: Diagnosis not present

## 2020-03-04 DIAGNOSIS — J3503 Chronic tonsillitis and adenoiditis: Secondary | ICD-10-CM | POA: Diagnosis not present

## 2020-07-19 IMAGING — MR MR LUMBAR SPINE W/O CM
5 series · 48 of 48 positions shown · non-contrast
Comparison: Previous MRI from 02/21/2017.

CLINICAL DATA: Initial evaluation for central low back pain with
left hip and leg pain.

EXAM:
MRI LUMBAR SPINE WITHOUT CONTRAST
TECHNIQUE: Multiplanar, multisequence MR imaging of the lumbar spine was
performed. No intravenous contrast was administered.

[Series 3: T2 post-contrast · sagittal · 4.0mm · 0.88mm/px · 6 of 12 slices shown]
[im 1/12]
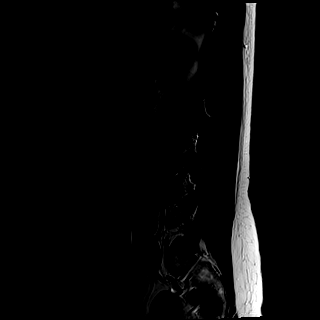
[im 3/12]
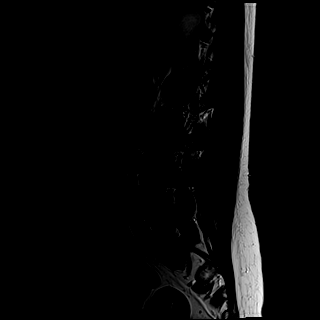
[im 5/12]
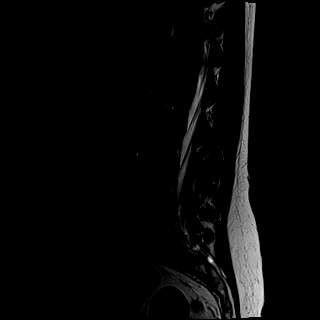
[im 7/12]
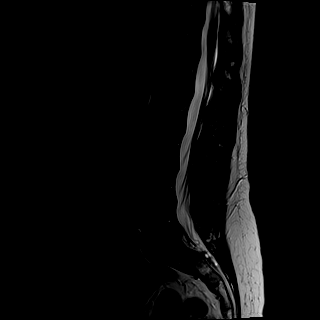
[im 9/12]
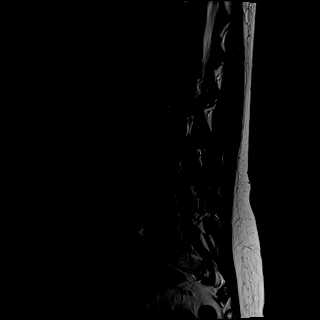
[im 12/12]
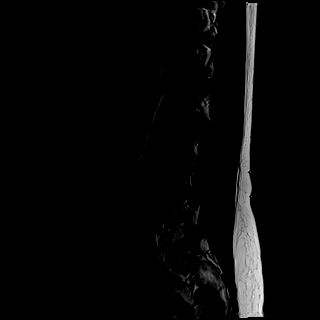

[Series 4: T1 · sagittal · 4.0mm · 0.88mm/px · 5 of 12 slices shown (1 of 2)]
[im 1/12]
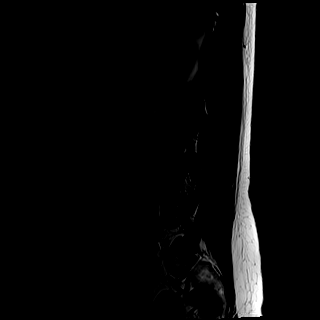
[im 3/12]
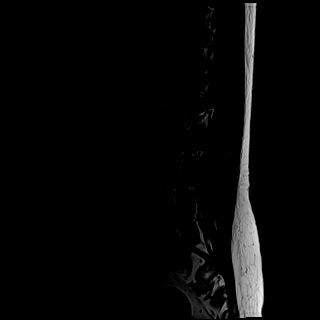
[im 6/12]
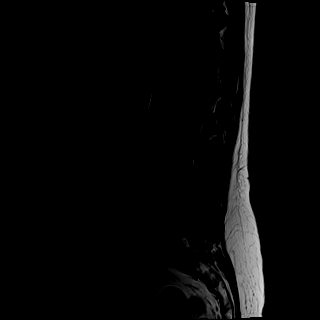
[im 9/12]
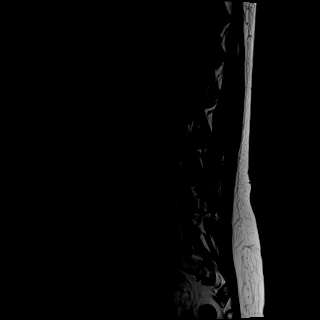
[im 12/12]
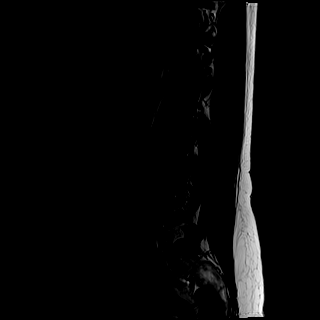

[Series 5: tirm sag · sagittal · 4.0mm · 0.55mm/px · 5 of 12 slices shown]
[im 1/12]
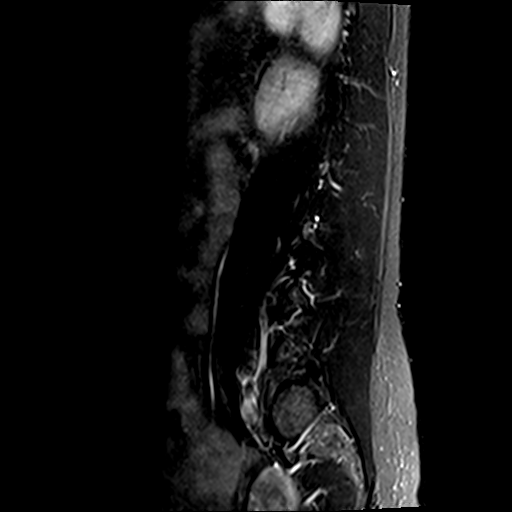
[im 3/12]
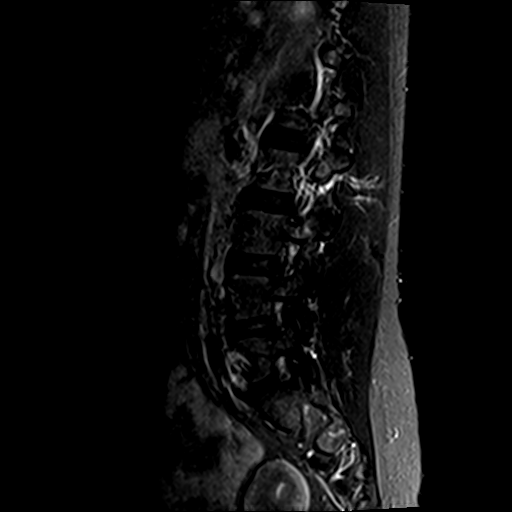
[im 6/12]
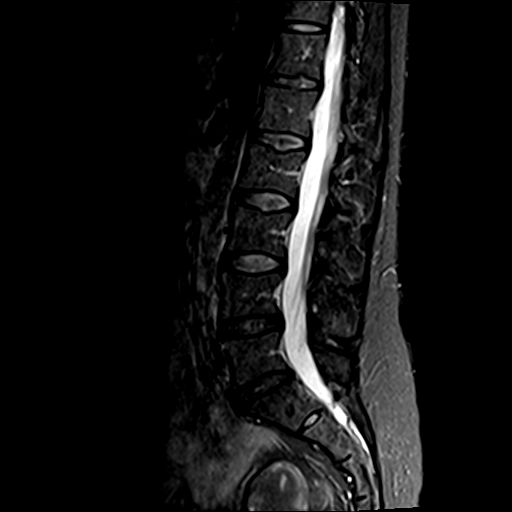
[im 9/12]
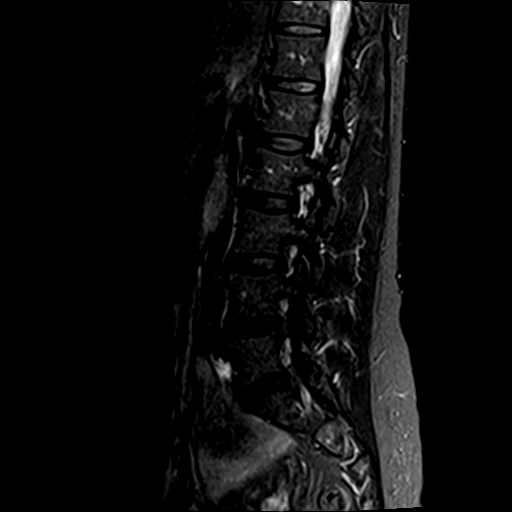
[im 12/12]
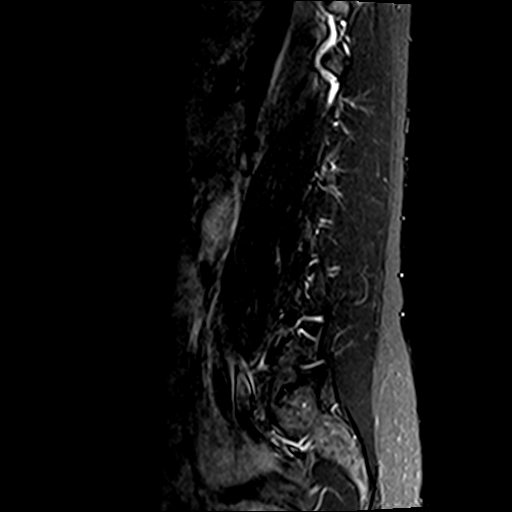

[Series 6: T1 · axial · 4.0mm · 0.78mm/px · z∈[-45,+160]mm · 16 of 35 slices shown (2 of 2)]
[im 1/35]
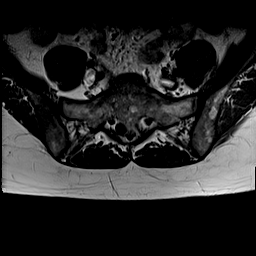
[im 3/35]
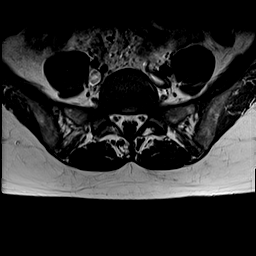
[im 5/35]
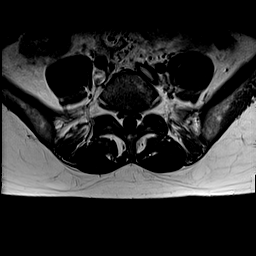
[im 7/35]
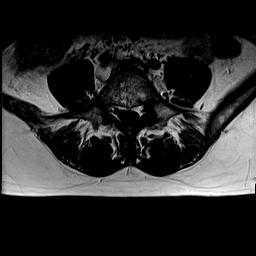
[im 10/35]
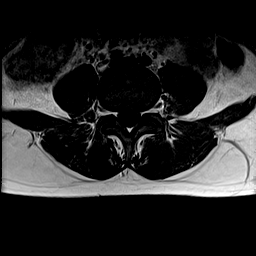
[im 12/35]
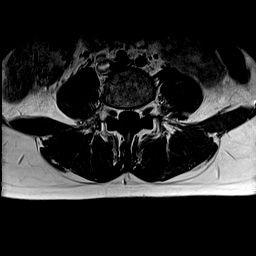
[im 14/35]
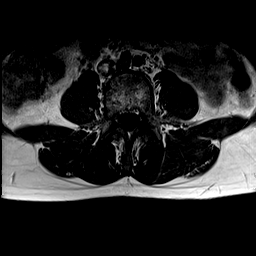
[im 16/35]
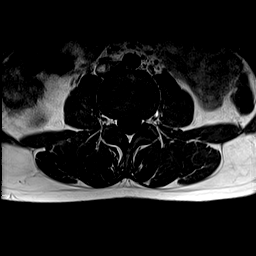
[im 19/35]
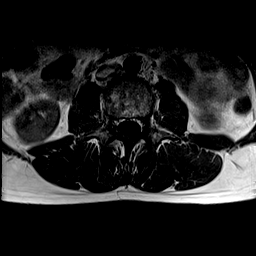
[im 21/35]
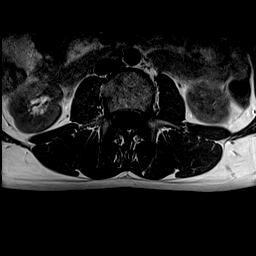
[im 23/35]
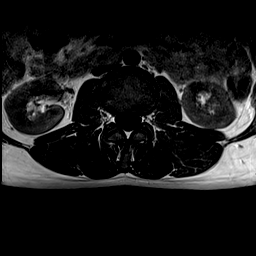
[im 25/35]
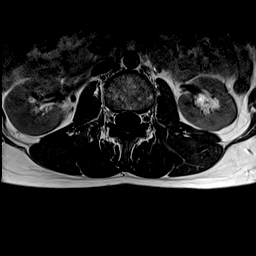
[im 28/35]
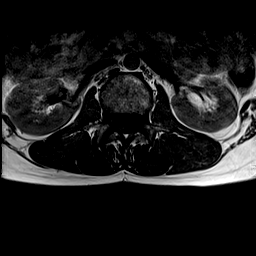
[im 30/35]
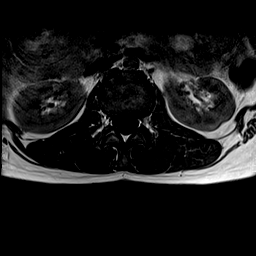
[im 32/35]
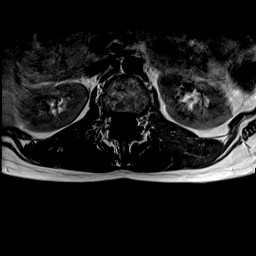
[im 35/35]
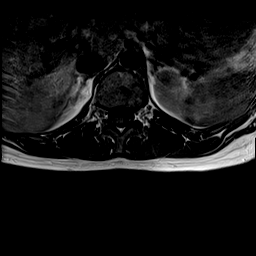

[Series 7: T2 · axial · 4.0mm · 1.04mm/px · z∈[-45,+160]mm · 16 of 35 slices shown]
[im 1/35]
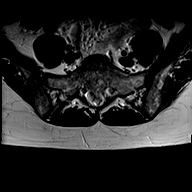
[im 3/35]
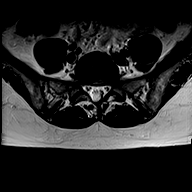
[im 5/35]
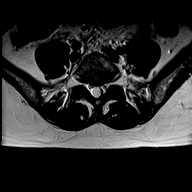
[im 7/35]
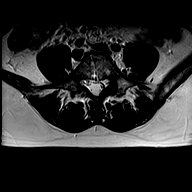
[im 10/35]
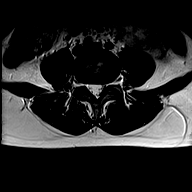
[im 12/35]
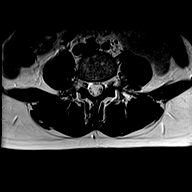
[im 14/35]
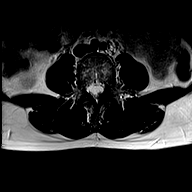
[im 16/35]
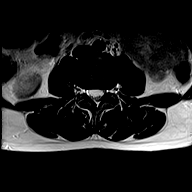
[im 19/35]
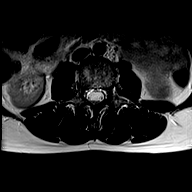
[im 21/35]
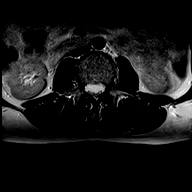
[im 23/35]
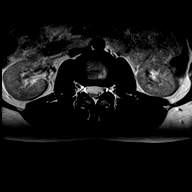
[im 25/35]
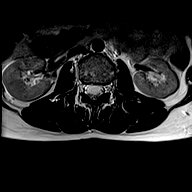
[im 28/35]
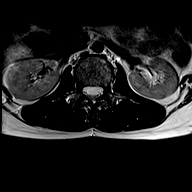
[im 30/35]
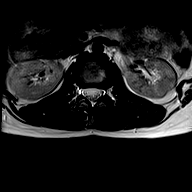
[im 32/35]
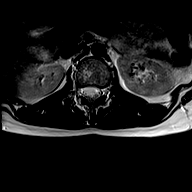
[im 35/35]
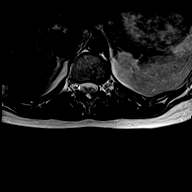

[48 of 48 positions shown; findings below may reference images not displayed]

FINDINGS: Segmentation: Standard. Lowest well-formed disc space labeled the
L5-S1 level.

Alignment: Vertebral bodies normally aligned with preservation of
the normal lumbar lordosis. No listhesis.

Vertebrae: Vertebral body height maintained without evidence for
acute or chronic fracture. Bone marrow signal intensity diffusely
heterogeneous with a few scattered benign hemangiomata noted. No
worrisome osseous lesions. No abnormal marrow edema.

Conus medullaris and cauda equina: Conus extends to the L1 level.
Conus and cauda equina appear normal.

Paraspinal and other soft tissues: Paraspinous soft tissues within
normal limits. Visualized visceral structures are normal.

Disc levels:

L1-2:  Unremarkable.

L2-3:  Unremarkable.

L3-4:  Unremarkable.

L4-5: Mild disc bulge with disc desiccation. Shallow right
foraminal/extraforaminal disc protrusion with associated annular
fissure closely approximates the exiting right L4 nerve root (series
7, image 26), stable. Minimal facet degeneration. No significant
canal or lateral recess stenosis. Mild bilateral L4 foraminal
narrowing. Appearance is stable.

L5-S1: Mild diffuse disc bulge with disc desiccation. Annular
fissure present at the level of the left lateral recess, closely
approximating the descending left S1 nerve root (series 7, image
32). This is stable from previous. No significant canal or lateral
recess stenosis. Foramina remain patent.
IMPRESSION: 1. Mild disc bulging at L5-S1 with superimposed left subarticular
annular fissure, closely approximating and potentially irritating
the descending left S1 nerve root.
2. Shallow right foraminal/extraforaminal disc protrusion at L4-5,
closely approximating and potentially irritating the exiting right
L4 nerve root.

## 2021-03-09 ENCOUNTER — Other Ambulatory Visit: Payer: Self-pay

## 2021-03-09 ENCOUNTER — Ambulatory Visit (INDEPENDENT_AMBULATORY_CARE_PROVIDER_SITE_OTHER): Payer: BC Managed Care – PPO | Admitting: Obstetrics & Gynecology

## 2021-03-09 ENCOUNTER — Encounter: Payer: Self-pay | Admitting: Obstetrics & Gynecology

## 2021-03-09 VITALS — BP 108/67 | HR 67 | Ht 63.0 in | Wt 124.8 lb

## 2021-03-09 DIAGNOSIS — Z01419 Encounter for gynecological examination (general) (routine) without abnormal findings: Secondary | ICD-10-CM

## 2021-03-09 NOTE — Progress Notes (Signed)
   WELL-WOMAN EXAMINATION Patient name: Tina Morris MRN [id number]  Date of birth: Nov 05, 1975 Chief Complaint:   Gynecologic Exam  History of Present Illness:   Tina Morris is a 45 y.o. G3P3 female being seen today for a routine well-woman exam.   Prior Tina Morris patient-see 08/2019  -h/o endometriosis- s/p [facility] ovarian cystectomy 05/07/2019.  Previously managed with OCPs.  Stopped OCPs as she noted considerable weight gain that she couldn't lose.  She has been able to lose 10lbs with eating a healthy diet.  Now off OCPs-initially periods were terrible for several months, but has been doing well without.  Menses regular, dysmenorrhea manageable.  Overall doing well and reports no acute complaints.   Patient's last menstrual period was 03/03/2021 (exact date).  Last pap 11/2018 neg.  Last mammogram: completes thermal screening. Last colonoscopy: n/a   Review of Systems:   Pertinent items are noted in HPI Denies any headaches, blurred vision, fatigue, shortness of breath, chest pain, abdominal pain, bowel movements, urination, or intercourse unless otherwise stated above.  Pertinent History Reviewed:  Reviewed past medical,surgical, social and family history.  Reviewed problem list, medications and allergies. Physical Assessment:   Vitals:   03/09/21 1046  BP: 108/67  Pulse: 67  Weight: 124 lb 12.8 oz (56.6 kg)  Height: '5\' 3"'$  (1.6 m)  Body mass index is 22.11 kg/m.        Physical Examination:   General appearance - well appearing, and in no distress  Mental status - alert, oriented to person, place, and time  Psych:  She has a normal mood and affect  Skin - warm and dry, normal color, no suspicious lesions noted  Chest - effort normal, all lung fields clear to auscultation bilaterally  Heart - normal rate and regular rhythm  Neck:  midline trachea, no thyromegaly or nodules  Breasts - breasts appear normal, no suspicious masses, no skin or nipple changes or  axillary nodes  Abdomen  - soft, nontender, nondistended, no masses or organomegaly  Pelvic - VULVA: normal appearing vulva with no masses, tenderness or lesions  VAGINA: normal appearing vagina with normal color and discharge, no lesions  CERVIX: normal appearing cervix without discharge or lesions, no CMT  UTERUS: uterus is felt to be normal size, shape, consistency and nontender   ADNEXA: No adnexal masses or tenderness noted.  Extremities:  No swelling or varicosities noted  Chaperone:  Declined      Assessment & Plan:  1) Well-Woman Exam -pap up to date, reviewed screening guidelines -though pt would prefer not to complete mammogram, encouraged pt to consider every few yrs- may reconsider for next year  2) Endometriosis -doing well without medical management -reviewed OTC remedies like chasteberry tea  No orders of the defined types were placed in this encounter.   Meds: No orders of the defined types were placed in this encounter.   Follow-up: Return in about 1 year (around 03/09/2022), or please print AVS, for Annual, with Dr. Nelda Marseille.   Janyth Pupa, DO Attending [place], [facility] for [employer], [place]

## 2021-03-09 NOTE — Patient Instructions (Signed)
Chastberry tea (vitex)

## 2021-06-07 ENCOUNTER — Telehealth: Payer: Self-pay | Admitting: Obstetrics & Gynecology

## 2021-06-07 DIAGNOSIS — N760 Acute vaginitis: Secondary | ICD-10-CM

## 2021-06-07 MED ORDER — FLUCONAZOLE 150 MG PO TABS: 150.0000 mg | ORAL_TABLET | Freq: Once | ORAL | 3 refills | Status: AC

## 2021-06-07 NOTE — Telephone Encounter (Signed)
Called patient- reports h/o yeast infections though it has been quite a while. She has some leftover Diflucan, but it has expired.  Notes itching and irritation.  No odor or discharge.  No other acute complaints.  Rx for Diflucan sent in, f/u if worsening or no improvement.  Janyth Pupa, DO Attending [place], [facility] for [employer], [place]

## 2021-06-07 NOTE — Telephone Encounter (Signed)
Patient calling stating itching, burning, no oder, a little discharge wanting to know if you could send her something in to [facility] [place] and she states that to remind you that it takes to doses for it to work for her.Marland KitchenMarland Kitchen

## 2021-06-07 NOTE — Addendum Note (Signed)
Addended by: Annalee Genta on: 06/07/2021 04:41 PM   Modules accepted: Orders

## 2021-07-18 ENCOUNTER — Other Ambulatory Visit (INDEPENDENT_AMBULATORY_CARE_PROVIDER_SITE_OTHER): Payer: BC Managed Care – PPO

## 2021-07-18 ENCOUNTER — Other Ambulatory Visit: Payer: Self-pay

## 2021-07-18 ENCOUNTER — Other Ambulatory Visit (HOSPITAL_COMMUNITY)
Admission: RE | Admit: 2021-07-18 | Discharge: 2021-07-18 | Disposition: A | Discharge status: Home / Self Care | Payer: BC Managed Care – PPO | Source: Ambulatory Visit | Attending: Obstetrics & Gynecology | Admitting: Obstetrics & Gynecology

## 2021-07-18 DIAGNOSIS — N898 Other specified noninflammatory disorders of vagina: Secondary | ICD-10-CM

## 2021-07-18 NOTE — Progress Notes (Signed)
° °  NURSE VISIT- VAGINITIS/STD  SUBJECTIVE:  Yessica Somma is a 46 y.o. [id number] GYN patientfemale here for a vaginal swab for vaginitis screening, STD screen.  She reports the following symptoms: odor for 1 week.  Took dose of Diflucan as she thought it was a yeast infection but medication did not help.  Denies abnormal vaginal bleeding, significant pelvic pain, fever, or UTI symptoms.  OBJECTIVE:  There were no vitals taken for this visit.  Appears well, in no apparent distress  ASSESSMENT: Vaginal swab for vaginitis screening/STD screen  PLAN: Self-collected vaginal probe for Gonorrhea, Chlamydia, Trichomonas, Bacterial Vaginosis, Yeast sent to lab Treatment: to be determined once results are received Follow-up as needed if symptoms persist/worsen, or new symptoms develop  Alice Rieger  07/18/2021 4:16 PM

## 2021-07-20 LAB — CERVICOVAGINAL ANCILLARY ONLY
Bacterial Vaginitis (gardnerella): POSITIVE — AB
Candida Glabrata: NEGATIVE
Candida Vaginitis: POSITIVE — AB
Chlamydia: NEGATIVE
Comment: NEGATIVE
Comment: NEGATIVE
Comment: NEGATIVE
Comment: NEGATIVE
Comment: NEGATIVE
Comment: NORMAL
Neisseria Gonorrhea: NEGATIVE
Trichomonas: NEGATIVE

## 2021-07-21 ENCOUNTER — Telehealth: Payer: Self-pay | Admitting: Adult Health

## 2021-07-21 ENCOUNTER — Other Ambulatory Visit: Payer: Self-pay | Admitting: Adult Health

## 2021-07-21 MED ORDER — FLUCONAZOLE 150 MG PO TABS: ORAL_TABLET | ORAL | 1 refills | Status: DC

## 2021-07-21 MED ORDER — METRONIDAZOLE 500 MG PO TABS: 500.0000 mg | ORAL_TABLET | Freq: Two times a day (BID) | ORAL | 0 refills | Status: DC

## 2021-07-21 NOTE — Telephone Encounter (Signed)
Spoke with Tina Morris regarding test results. She sent in medications to pt's pharmacy. Returned pt's call, two identifiers used. Informed pt of test results and prescriptions sent in and how to take, also avoid alcohol and sex while taking meds. Pt confirmed understanding.

## 2021-07-21 NOTE — Telephone Encounter (Signed)
Patient called wanting to know if there is any way possible that someone could call her with her test results from her selfswab and if any medline is being sent to pharmacy in the next 2 hours as she is leaving to go out of town.

## 2021-07-21 NOTE — Progress Notes (Signed)
+  BV and yeast will rx flagyl and diflcuan

## 2021-10-24 ENCOUNTER — Other Ambulatory Visit: Payer: Self-pay | Admitting: Obstetrics & Gynecology

## 2021-10-24 ENCOUNTER — Ambulatory Visit: Payer: BC Managed Care – PPO | Admitting: Obstetrics & Gynecology

## 2021-10-24 ENCOUNTER — Encounter: Payer: Self-pay | Admitting: Obstetrics & Gynecology

## 2021-10-24 VITALS — BP 108/67 | HR 77 | Ht 64.0 in | Wt 124.8 lb

## 2021-10-24 DIAGNOSIS — N6011 Diffuse cystic mastopathy of right breast: Secondary | ICD-10-CM | POA: Diagnosis not present

## 2021-10-24 DIAGNOSIS — N644 Mastodynia: Secondary | ICD-10-CM

## 2021-10-24 NOTE — Progress Notes (Signed)
? ?  GYN VISIT ?Patient name: Tina Morris MRN [id number]  Date of birth: December 16, 1975 ?Chief Complaint:   ?Breast Problem ? ?History of Present Illness:   ?Tina Morris is a 46 y.o. G69P3003 female being seen today for the following concerns: ? ?-Breast concerns: Called in due to worsening soreness on her right side.  Started about 2 mos ago-initially thought due to fall, but has not resolved.  She also thought she felt a knot on that side, but that seems to have gone.  Just completed menses ? ?Pt prefers to avoid mammograms- completes thermography scans ? ?Patient's last menstrual period was 10/17/2021 (exact date). ? ?   ? View : No data to display.  ?  ?  ?  ? ? ? ?Review of Systems:   ?Pertinent items are noted in HPI ?Denies fever/chills, dizziness, headaches, visual disturbances, fatigue, shortness of breath, chest pain, abdominal pain, vomiting, no problems with periods, bowel movements, urination, or intercourse unless otherwise stated above.  ?Pertinent History Reviewed:  ?Reviewed past medical,surgical, social, obstetrical and family history.  ?Reviewed problem list, medications and allergies. ?Physical Assessment:  ? ?Vitals:  ? 10/24/21 1529  ?BP: 108/67  ?Pulse: 77  ?Weight: 124 lb 12.8 oz (56.6 kg)  ?Height: '5\' 4"'$  (1.626 m)  ?Body mass index is 21.42 kg/m?. ? ?     Physical Examination:  ? General appearance: alert, well appearing, and in no distress ? Psych: mood appropriate, normal affect ? Skin: warm & dry  ? Breast: normal left breast, outer right quadrant with fibrocystic/dense tissue noted, no discrete mass appreciated, minimal tenderness on exam ? Cardiovascular: normal heart rate noted ? Respiratory: normal respiratory effort, no distress ? Pelvic: examination not indicated ? Extremities: no edema  ? ?Chaperone: N/A   ? ?Assessment & Plan:  ?1) Breast tenderness with firmness noted ?-ideally pt to complete mammogram; however declined due to concern from radiation exposure ?-she is agreeable to breast  ultrasound ?-order placed ? ?2) Family planning ?-discussed that likelihood of natural pregnancy low ?-reviewed risk of genetic abnormalities ? ? ?Janyth Pupa, DO ?Attending [place], [facility] ?[facility] for [place] ? ? ? ?

## 2021-11-10 ENCOUNTER — Telehealth: Payer: Self-pay | Admitting: Obstetrics & Gynecology

## 2021-11-10 NOTE — Telephone Encounter (Signed)
Patient calling stating that the scheduling dept states that she would need a mammo not just a ultra of the breast. Patient states that she doesn't want to get a mammo and wanted to know if you are still okay with just the ultra sound asking if you would call her [phone]

## 2021-11-17 ENCOUNTER — Telehealth: Payer: Self-pay | Admitting: Obstetrics & Gynecology

## 2021-11-17 NOTE — Telephone Encounter (Signed)
Patient called stating that the imaging will not do the breast ultra sound unless the patient does a mammo first, even if she pays cash. She states that she has not had any pain lately and doesn't know if maybe the pain was a fluc or what. But she wanted to know if she should just forget about the ultra sound or if she should have the mammo. Asking for a call back.

## 2022-02-16 DIAGNOSIS — Z800 Family history of malignant neoplasm of digestive organs: Secondary | ICD-10-CM | POA: Diagnosis not present

## 2022-02-16 DIAGNOSIS — K5904 Chronic idiopathic constipation: Secondary | ICD-10-CM | POA: Diagnosis not present

## 2022-02-16 DIAGNOSIS — Z1211 Encounter for screening for malignant neoplasm of colon: Secondary | ICD-10-CM | POA: Diagnosis not present

## 2022-03-17 DIAGNOSIS — K633 Ulcer of intestine: Secondary | ICD-10-CM | POA: Diagnosis not present

## 2022-03-17 DIAGNOSIS — Z800 Family history of malignant neoplasm of digestive organs: Secondary | ICD-10-CM | POA: Diagnosis not present

## 2022-03-17 DIAGNOSIS — Z1211 Encounter for screening for malignant neoplasm of colon: Secondary | ICD-10-CM | POA: Diagnosis not present

## 2022-07-04 ENCOUNTER — Ambulatory Visit: Payer: BC Managed Care – PPO | Admitting: Obstetrics & Gynecology

## 2022-07-11 DIAGNOSIS — R350 Frequency of micturition: Secondary | ICD-10-CM | POA: Diagnosis not present

## 2022-07-13 ENCOUNTER — Ambulatory Visit (INDEPENDENT_AMBULATORY_CARE_PROVIDER_SITE_OTHER): Payer: BC Managed Care – PPO | Admitting: *Deleted

## 2022-07-13 ENCOUNTER — Other Ambulatory Visit (HOSPITAL_COMMUNITY)
Admission: RE | Admit: 2022-07-13 | Discharge: 2022-07-13 | Disposition: A | Discharge status: Home / Self Care | Payer: BC Managed Care – PPO | Source: Ambulatory Visit | Attending: Obstetrics & Gynecology | Admitting: Obstetrics & Gynecology

## 2022-07-13 DIAGNOSIS — R350 Frequency of micturition: Secondary | ICD-10-CM | POA: Insufficient documentation

## 2022-07-13 LAB — POCT URINALYSIS DIPSTICK OB
Blood, UA: NEGATIVE
Glucose, UA: NEGATIVE
Ketones, UA: NEGATIVE
Nitrite, UA: NEGATIVE
POC,PROTEIN,UA: NEGATIVE

## 2022-07-13 NOTE — Progress Notes (Signed)
   NURSE VISIT- UTI SYMPTOMS   SUBJECTIVE:  Tina Morris is a 47 y.o. G44P3003 female here for UTI symptoms. She is a GYN patient. She reports urinary frequency.  OBJECTIVE:  There were no vitals taken for this visit.  Appears well, in no apparent distress  No results found for this or any previous visit (from the past 24 hour(s)).  ASSESSMENT: GYN patient with UTI symptoms and negative nitrites  PLAN: Note routed to Derrek Monaco, AGNP   Rx sent by provider today: No Urine culture sent Call or return to clinic prn if these symptoms worsen or fail to improve as anticipated. Follow-up: as needed   Also sent swab for bv and yeast. Pt does not have mychart so is requesting a phone call.   Tina Morris  07/13/2022 4:07 PM

## 2022-07-14 LAB — URINALYSIS
Bilirubin, UA: NEGATIVE
Glucose, UA: NEGATIVE
Ketones, UA: NEGATIVE
Nitrite, UA: NEGATIVE
Protein,UA: NEGATIVE
RBC, UA: NEGATIVE
Specific Gravity, UA: 1.021 (ref 1.005–1.030)
Urobilinogen, Ur: 0.2 mg/dL (ref 0.2–1.0)
pH, UA: 6 (ref 5.0–7.5)

## 2022-07-16 LAB — URINE CULTURE

## 2022-07-17 ENCOUNTER — Telehealth: Payer: Self-pay | Admitting: *Deleted

## 2022-07-17 ENCOUNTER — Other Ambulatory Visit: Payer: Self-pay | Admitting: Adult Health

## 2022-07-17 LAB — CERVICOVAGINAL ANCILLARY ONLY
Bacterial Vaginitis (gardnerella): NEGATIVE
Candida Glabrata: NEGATIVE
Candida Vaginitis: NEGATIVE
Comment: NEGATIVE
Comment: NEGATIVE
Comment: NEGATIVE

## 2022-07-17 MED ORDER — NITROFURANTOIN MONOHYD MACRO 100 MG PO CAPS: 100.0000 mg | ORAL_CAPSULE | Freq: Two times a day (BID) | ORAL | 0 refills | Status: DC

## 2022-07-17 NOTE — Telephone Encounter (Signed)
Pt aware she has a UTI and med was sent to pharmacy. Pt voiced understanding. [place]

## 2022-07-17 NOTE — Progress Notes (Signed)
+  Ecoli on urine  will rx Macrobid push fluids

## 2022-07-17 NOTE — Telephone Encounter (Signed)
-----  Message from Estill Dooms, NP sent at 07/17/2022  1:41 PM EST ----- Can you let her know negative BV and yeast THX

## 2022-07-17 NOTE — Telephone Encounter (Signed)
-----  Message from Estill Dooms, NP sent at 07/17/2022  9:19 AM EST ----- Let her know urine + E coli and macrobid sent in [facility]

## 2022-07-17 NOTE — Telephone Encounter (Signed)
Pt aware swab was negative for yeast and BV. Pt voiced understanding. [place]

## 2022-07-19 DIAGNOSIS — E785 Hyperlipidemia, unspecified: Secondary | ICD-10-CM | POA: Diagnosis not present

## 2022-07-19 DIAGNOSIS — L680 Hirsutism: Secondary | ICD-10-CM | POA: Diagnosis not present

## 2022-07-19 DIAGNOSIS — Z0000 Encounter for general adult medical examination without abnormal findings: Secondary | ICD-10-CM | POA: Diagnosis not present

## 2022-07-19 DIAGNOSIS — E559 Vitamin D deficiency, unspecified: Secondary | ICD-10-CM | POA: Diagnosis not present

## 2022-07-19 DIAGNOSIS — N926 Irregular menstruation, unspecified: Secondary | ICD-10-CM | POA: Diagnosis not present

## 2022-07-19 DIAGNOSIS — R635 Abnormal weight gain: Secondary | ICD-10-CM | POA: Diagnosis not present

## 2022-07-19 DIAGNOSIS — Z131 Encounter for screening for diabetes mellitus: Secondary | ICD-10-CM | POA: Diagnosis not present

## 2022-07-20 DIAGNOSIS — R635 Abnormal weight gain: Secondary | ICD-10-CM | POA: Diagnosis not present

## 2022-07-20 DIAGNOSIS — E559 Vitamin D deficiency, unspecified: Secondary | ICD-10-CM | POA: Diagnosis not present

## 2022-07-20 DIAGNOSIS — M791 Myalgia, unspecified site: Secondary | ICD-10-CM | POA: Diagnosis not present

## 2022-08-11 ENCOUNTER — Ambulatory Visit: Payer: BC Managed Care – PPO | Admitting: Obstetrics & Gynecology

## 2022-08-11 ENCOUNTER — Ambulatory Visit (HOSPITAL_BASED_OUTPATIENT_CLINIC_OR_DEPARTMENT_OTHER)
Admission: RE | Admit: 2022-08-11 | Discharge: 2022-08-11 | Disposition: A | Discharge status: Home / Self Care | Payer: BC Managed Care – PPO | Source: Ambulatory Visit | Attending: Obstetrics & Gynecology | Admitting: Obstetrics & Gynecology

## 2022-08-11 ENCOUNTER — Encounter (HOSPITAL_BASED_OUTPATIENT_CLINIC_OR_DEPARTMENT_OTHER): Payer: Self-pay | Admitting: Reproductive Endocrinology

## 2022-08-11 ENCOUNTER — Encounter: Payer: Self-pay | Admitting: Obstetrics & Gynecology

## 2022-08-11 ENCOUNTER — Other Ambulatory Visit: Payer: Self-pay | Admitting: Reproductive Endocrinology

## 2022-08-11 ENCOUNTER — Other Ambulatory Visit (HOSPITAL_COMMUNITY)
Admission: RE | Admit: 2022-08-11 | Discharge: 2022-08-11 | Disposition: A | Discharge status: Home / Self Care | Payer: BC Managed Care – PPO | Source: Ambulatory Visit | Attending: Obstetrics & Gynecology | Admitting: Obstetrics & Gynecology

## 2022-08-11 VITALS — BP 127/74 | HR 74 | Wt 137.2 lb

## 2022-08-11 DIAGNOSIS — Z319 Encounter for procreative management, unspecified: Secondary | ICD-10-CM

## 2022-08-11 DIAGNOSIS — R102 Pelvic and perineal pain: Secondary | ICD-10-CM | POA: Insufficient documentation

## 2022-08-11 DIAGNOSIS — Z8742 Personal history of other diseases of the female genital tract: Secondary | ICD-10-CM | POA: Insufficient documentation

## 2022-08-11 DIAGNOSIS — Z01411 Encounter for gynecological examination (general) (routine) with abnormal findings: Secondary | ICD-10-CM

## 2022-08-11 DIAGNOSIS — N951 Menopausal and female climacteric states: Secondary | ICD-10-CM

## 2022-08-11 DIAGNOSIS — Z3009 Encounter for other general counseling and advice on contraception: Secondary | ICD-10-CM

## 2022-08-11 DIAGNOSIS — Z01419 Encounter for gynecological examination (general) (routine) without abnormal findings: Secondary | ICD-10-CM | POA: Insufficient documentation

## 2022-08-11 DIAGNOSIS — Z90721 Acquired absence of ovaries, unilateral: Secondary | ICD-10-CM | POA: Insufficient documentation

## 2022-08-11 NOTE — Progress Notes (Signed)
   WELL-WOMAN EXAMINATION Patient name: Tina Morris MRN [id number]  Date of birth: 1975/12/06 Chief Complaint:   Annual Exam  History of Present Illness:   Tina Morris is a 47 y.o. G66P3003 female being seen today for a routine well-woman exam and the following concerns:  -Fertility management: Pt desires another pregnancy.  She is aware that her window is closing and is seeing a facility in [place].  They have requesting imaging and testing, which most of it has already been completed.  Periods are irregular- sometimes skipping a month, sometimes light.  Pt with h/o endometriosis- previously managed with OCPs.  Currently not on medication.  On occasion will have pelvic pain, but typically tolerable with conservative management.  Last pap due today.  Last mammogram: pt declined. Last colonoscopy: this year, brother with colon cancer      No data to display            Review of Systems:   Pertinent items are noted in HPI Denies any headaches, blurred vision, fatigue, shortness of breath, chest pain, abdominal pain, bowel movements, urination, or intercourse unless otherwise stated above.  Pertinent History Reviewed:  Reviewed past medical,surgical, social and family history.  Reviewed problem list, medications and allergies. Physical Assessment:   Vitals:   08/11/22 1005  BP: 127/74  Pulse: 74  Weight: 137 lb 3.2 oz (62.2 kg)  Body mass index is 23.55 kg/m.        Physical Examination:   General appearance - well appearing, and in no distress  Mental status - alert, oriented to person, place, and time  Psych:  She has a normal mood and affect  Skin - warm and dry, normal color, no suspicious lesions noted  Chest - effort normal, all lung fields clear to auscultation bilaterally  Heart - normal rate and regular rhythm  Neck:  midline trachea, no thyromegaly or nodules  Breasts - breasts appear normal, no suspicious masses, no skin or nipple changes or  axillary nodes  Abdomen -  soft, nontender, nondistended, no masses or organomegaly  Pelvic - VULVA: normal appearing vulva with no masses, tenderness or lesions  VAGINA: normal appearing vagina with normal color and discharge, no lesions  CERVIX: normal appearing cervix without discharge or lesions, no CMT  Thin prep pap is done with HR HPV cotesting  UTERUS: uterus is felt to be normal size, shape, consistency and nontender   ADNEXA: No adnexal masses or tenderness noted.  Extremities:  No swelling or varicosities noted  Chaperone: Journalist, newspaper & Plan:  1) Well-Woman Exam -pap collected, reviewed screening guidelines -pt declines mammogram -colonoscopy up to date  2) Pelvic pain, h/o ovarian cyst and endometriosis -pelvic US to be completed for further evaluation -further management pending results  3) Fertility/Family planning -reviewed current lab work.  Long discussion with patient regarding current labs and discussed importance of AMH which is currently pending -discussed with AMA increased risk of miscarriage and genetic abnormalities.  Discussed higher success rate with donor eggs, which she is not interested in at this time -has upcoming appt with [facility]  Orders Placed This Encounter  Procedures   US PELVIC COMPLETE WITH TRANSVAGINAL    Meds: No orders of the defined types were placed in this encounter.   Follow-up: Return in about 1 year (around 08/12/2023) for Annual.   Janyth Pupa, DO Attending [place], [place] for [facility], [place]

## 2022-08-15 LAB — CYTOLOGY - PAP
Comment: NEGATIVE
Diagnosis: NEGATIVE
High risk HPV: NEGATIVE

## 2022-08-16 ENCOUNTER — Telehealth: Payer: Self-pay | Admitting: *Deleted

## 2022-08-16 NOTE — Telephone Encounter (Signed)
Called patient to let her know pap was negative and could repeat in 3-5 years.  She wanted to let Dr Nelda Marseille know, AMH was low.

## 2022-08-21 ENCOUNTER — Telehealth: Payer: Self-pay | Admitting: *Deleted

## 2022-08-21 NOTE — Telephone Encounter (Signed)
Erroneous encounter

## 2022-09-25 DIAGNOSIS — F4323 Adjustment disorder with mixed anxiety and depressed mood: Secondary | ICD-10-CM | POA: Diagnosis not present

## 2022-10-05 DIAGNOSIS — F4323 Adjustment disorder with mixed anxiety and depressed mood: Secondary | ICD-10-CM | POA: Diagnosis not present

## 2022-10-11 DIAGNOSIS — F4323 Adjustment disorder with mixed anxiety and depressed mood: Secondary | ICD-10-CM | POA: Diagnosis not present

## 2022-10-19 DIAGNOSIS — F4323 Adjustment disorder with mixed anxiety and depressed mood: Secondary | ICD-10-CM | POA: Diagnosis not present

## 2022-10-26 DIAGNOSIS — F4323 Adjustment disorder with mixed anxiety and depressed mood: Secondary | ICD-10-CM | POA: Diagnosis not present

## 2022-10-31 DIAGNOSIS — F4323 Adjustment disorder with mixed anxiety and depressed mood: Secondary | ICD-10-CM | POA: Diagnosis not present

## 2022-11-22 DIAGNOSIS — F4323 Adjustment disorder with mixed anxiety and depressed mood: Secondary | ICD-10-CM | POA: Diagnosis not present

## 2022-11-29 DIAGNOSIS — F4323 Adjustment disorder with mixed anxiety and depressed mood: Secondary | ICD-10-CM | POA: Diagnosis not present

## 2022-12-07 DIAGNOSIS — F4323 Adjustment disorder with mixed anxiety and depressed mood: Secondary | ICD-10-CM | POA: Diagnosis not present

## 2022-12-13 DIAGNOSIS — F4323 Adjustment disorder with mixed anxiety and depressed mood: Secondary | ICD-10-CM | POA: Diagnosis not present

## 2023-01-03 DIAGNOSIS — F4323 Adjustment disorder with mixed anxiety and depressed mood: Secondary | ICD-10-CM | POA: Diagnosis not present

## 2023-02-15 DIAGNOSIS — F4323 Adjustment disorder with mixed anxiety and depressed mood: Secondary | ICD-10-CM | POA: Diagnosis not present

## 2023-03-15 DIAGNOSIS — F4323 Adjustment disorder with mixed anxiety and depressed mood: Secondary | ICD-10-CM | POA: Diagnosis not present

## 2023-04-12 DIAGNOSIS — F4323 Adjustment disorder with mixed anxiety and depressed mood: Secondary | ICD-10-CM | POA: Diagnosis not present

## 2023-06-13 DIAGNOSIS — F4323 Adjustment disorder with mixed anxiety and depressed mood: Secondary | ICD-10-CM | POA: Diagnosis not present

## 2023-07-12 DIAGNOSIS — F4323 Adjustment disorder with mixed anxiety and depressed mood: Secondary | ICD-10-CM | POA: Diagnosis not present

## 2023-08-09 DIAGNOSIS — F4323 Adjustment disorder with mixed anxiety and depressed mood: Secondary | ICD-10-CM | POA: Diagnosis not present

## 2023-09-11 DIAGNOSIS — F4323 Adjustment disorder with mixed anxiety and depressed mood: Secondary | ICD-10-CM | POA: Diagnosis not present

## 2023-10-04 DIAGNOSIS — F4323 Adjustment disorder with mixed anxiety and depressed mood: Secondary | ICD-10-CM | POA: Diagnosis not present

## 2024-02-15 DIAGNOSIS — Z79890 Hormone replacement therapy (postmenopausal): Secondary | ICD-10-CM | POA: Diagnosis not present

## 2024-04-21 DIAGNOSIS — D225 Melanocytic nevi of trunk: Secondary | ICD-10-CM | POA: Diagnosis not present

## 2024-04-21 DIAGNOSIS — D485 Neoplasm of uncertain behavior of skin: Secondary | ICD-10-CM | POA: Diagnosis not present

## 2024-04-21 DIAGNOSIS — L700 Acne vulgaris: Secondary | ICD-10-CM | POA: Diagnosis not present

## 2024-05-15 DIAGNOSIS — N951 Menopausal and female climacteric states: Secondary | ICD-10-CM | POA: Diagnosis not present

## 2024-05-15 DIAGNOSIS — Z79890 Hormone replacement therapy (postmenopausal): Secondary | ICD-10-CM | POA: Diagnosis not present

## 2024-05-15 DIAGNOSIS — R635 Abnormal weight gain: Secondary | ICD-10-CM | POA: Diagnosis not present
